# Patient Record
Sex: Male | Born: 1960 | Race: White | Hispanic: No | Smoking: Former smoker
Health system: Southern US, Community
[De-identification: ages and names within clinical notes are randomized; demographics above are authoritative.]

## PROBLEM LIST (undated history)

## (undated) DIAGNOSIS — Z87891 Personal history of nicotine dependence: Secondary | ICD-10-CM

## (undated) HISTORY — PX: BACK SURGERY: SHX140

---

## 2009-11-09 ENCOUNTER — Emergency Department (HOSPITAL_COMMUNITY): Admission: EM | Admit: 2009-11-09 | Discharge: 2009-11-09 | Payer: Self-pay | Admitting: Emergency Medicine

## 2010-09-14 LAB — POCT I-STAT, CHEM 8
Calcium, Ion: 1.14 mmol/L (ref 1.12–1.32)
Glucose, Bld: 164 mg/dL — ABNORMAL HIGH (ref 70–99)
TCO2: 27 mmol/L (ref 0–100)

## 2011-08-02 DIAGNOSIS — F411 Generalized anxiety disorder: Secondary | ICD-10-CM | POA: Diagnosis not present

## 2011-09-20 DIAGNOSIS — F411 Generalized anxiety disorder: Secondary | ICD-10-CM | POA: Diagnosis not present

## 2011-09-28 DIAGNOSIS — F411 Generalized anxiety disorder: Secondary | ICD-10-CM | POA: Diagnosis not present

## 2011-10-05 DIAGNOSIS — E291 Testicular hypofunction: Secondary | ICD-10-CM | POA: Diagnosis not present

## 2011-10-05 DIAGNOSIS — N529 Male erectile dysfunction, unspecified: Secondary | ICD-10-CM | POA: Diagnosis not present

## 2011-10-14 DIAGNOSIS — N529 Male erectile dysfunction, unspecified: Secondary | ICD-10-CM | POA: Diagnosis not present

## 2012-01-10 DIAGNOSIS — F411 Generalized anxiety disorder: Secondary | ICD-10-CM | POA: Diagnosis not present

## 2012-02-24 DIAGNOSIS — E669 Obesity, unspecified: Secondary | ICD-10-CM | POA: Diagnosis not present

## 2012-02-24 DIAGNOSIS — J329 Chronic sinusitis, unspecified: Secondary | ICD-10-CM | POA: Diagnosis not present

## 2012-03-22 DIAGNOSIS — F411 Generalized anxiety disorder: Secondary | ICD-10-CM | POA: Diagnosis not present

## 2012-03-27 DIAGNOSIS — Z79899 Other long term (current) drug therapy: Secondary | ICD-10-CM | POA: Diagnosis not present

## 2012-03-29 DIAGNOSIS — E669 Obesity, unspecified: Secondary | ICD-10-CM | POA: Diagnosis not present

## 2012-04-19 DIAGNOSIS — Z125 Encounter for screening for malignant neoplasm of prostate: Secondary | ICD-10-CM | POA: Diagnosis not present

## 2012-04-19 DIAGNOSIS — E291 Testicular hypofunction: Secondary | ICD-10-CM | POA: Diagnosis not present

## 2012-04-19 DIAGNOSIS — N400 Enlarged prostate without lower urinary tract symptoms: Secondary | ICD-10-CM | POA: Diagnosis not present

## 2012-04-19 DIAGNOSIS — N529 Male erectile dysfunction, unspecified: Secondary | ICD-10-CM | POA: Diagnosis not present

## 2012-05-11 DIAGNOSIS — E291 Testicular hypofunction: Secondary | ICD-10-CM | POA: Diagnosis not present

## 2012-06-19 DIAGNOSIS — Z79899 Other long term (current) drug therapy: Secondary | ICD-10-CM | POA: Diagnosis not present

## 2012-06-23 DIAGNOSIS — L719 Rosacea, unspecified: Secondary | ICD-10-CM | POA: Diagnosis not present

## 2012-06-23 DIAGNOSIS — E669 Obesity, unspecified: Secondary | ICD-10-CM | POA: Diagnosis not present

## 2012-06-27 DIAGNOSIS — F411 Generalized anxiety disorder: Secondary | ICD-10-CM | POA: Diagnosis not present

## 2012-09-05 DIAGNOSIS — E291 Testicular hypofunction: Secondary | ICD-10-CM | POA: Diagnosis not present

## 2012-09-19 DIAGNOSIS — F411 Generalized anxiety disorder: Secondary | ICD-10-CM | POA: Diagnosis not present

## 2012-10-04 DIAGNOSIS — Z23 Encounter for immunization: Secondary | ICD-10-CM | POA: Diagnosis not present

## 2012-10-04 DIAGNOSIS — E669 Obesity, unspecified: Secondary | ICD-10-CM | POA: Diagnosis not present

## 2012-12-19 DIAGNOSIS — F411 Generalized anxiety disorder: Secondary | ICD-10-CM | POA: Diagnosis not present

## 2013-01-17 DIAGNOSIS — Z79899 Other long term (current) drug therapy: Secondary | ICD-10-CM | POA: Diagnosis not present

## 2013-01-17 DIAGNOSIS — L719 Rosacea, unspecified: Secondary | ICD-10-CM | POA: Diagnosis not present

## 2013-01-17 DIAGNOSIS — E669 Obesity, unspecified: Secondary | ICD-10-CM | POA: Diagnosis not present

## 2013-03-21 DIAGNOSIS — F411 Generalized anxiety disorder: Secondary | ICD-10-CM | POA: Diagnosis not present

## 2013-04-10 DIAGNOSIS — E291 Testicular hypofunction: Secondary | ICD-10-CM | POA: Diagnosis not present

## 2013-04-10 DIAGNOSIS — N400 Enlarged prostate without lower urinary tract symptoms: Secondary | ICD-10-CM | POA: Diagnosis not present

## 2013-04-17 DIAGNOSIS — N529 Male erectile dysfunction, unspecified: Secondary | ICD-10-CM | POA: Diagnosis not present

## 2013-04-17 DIAGNOSIS — N400 Enlarged prostate without lower urinary tract symptoms: Secondary | ICD-10-CM | POA: Diagnosis not present

## 2013-04-17 DIAGNOSIS — E291 Testicular hypofunction: Secondary | ICD-10-CM | POA: Diagnosis not present

## 2013-04-25 DIAGNOSIS — Z6834 Body mass index (BMI) 34.0-34.9, adult: Secondary | ICD-10-CM | POA: Diagnosis not present

## 2013-04-25 DIAGNOSIS — E669 Obesity, unspecified: Secondary | ICD-10-CM | POA: Diagnosis not present

## 2013-04-25 DIAGNOSIS — Z713 Dietary counseling and surveillance: Secondary | ICD-10-CM | POA: Diagnosis not present

## 2013-06-06 DIAGNOSIS — F411 Generalized anxiety disorder: Secondary | ICD-10-CM | POA: Diagnosis not present

## 2013-07-24 DIAGNOSIS — E669 Obesity, unspecified: Secondary | ICD-10-CM | POA: Diagnosis not present

## 2013-07-24 DIAGNOSIS — Z713 Dietary counseling and surveillance: Secondary | ICD-10-CM | POA: Diagnosis not present

## 2013-07-24 DIAGNOSIS — Z79899 Other long term (current) drug therapy: Secondary | ICD-10-CM | POA: Diagnosis not present

## 2013-07-24 DIAGNOSIS — Z6832 Body mass index (BMI) 32.0-32.9, adult: Secondary | ICD-10-CM | POA: Diagnosis not present

## 2013-08-24 DIAGNOSIS — J329 Chronic sinusitis, unspecified: Secondary | ICD-10-CM | POA: Diagnosis not present

## 2013-08-24 DIAGNOSIS — L719 Rosacea, unspecified: Secondary | ICD-10-CM | POA: Diagnosis not present

## 2013-11-09 DIAGNOSIS — E669 Obesity, unspecified: Secondary | ICD-10-CM | POA: Diagnosis not present

## 2013-11-09 DIAGNOSIS — Z6833 Body mass index (BMI) 33.0-33.9, adult: Secondary | ICD-10-CM | POA: Diagnosis not present

## 2013-11-09 DIAGNOSIS — Z713 Dietary counseling and surveillance: Secondary | ICD-10-CM | POA: Diagnosis not present

## 2013-12-03 DIAGNOSIS — H43399 Other vitreous opacities, unspecified eye: Secondary | ICD-10-CM | POA: Diagnosis not present

## 2013-12-11 DIAGNOSIS — F411 Generalized anxiety disorder: Secondary | ICD-10-CM | POA: Diagnosis not present

## 2014-03-12 DIAGNOSIS — F411 Generalized anxiety disorder: Secondary | ICD-10-CM | POA: Diagnosis not present

## 2014-04-16 DIAGNOSIS — E291 Testicular hypofunction: Secondary | ICD-10-CM | POA: Diagnosis not present

## 2014-04-16 DIAGNOSIS — Z125 Encounter for screening for malignant neoplasm of prostate: Secondary | ICD-10-CM | POA: Diagnosis not present

## 2014-04-16 DIAGNOSIS — N400 Enlarged prostate without lower urinary tract symptoms: Secondary | ICD-10-CM | POA: Diagnosis not present

## 2014-04-23 DIAGNOSIS — E291 Testicular hypofunction: Secondary | ICD-10-CM | POA: Diagnosis not present

## 2014-04-23 DIAGNOSIS — N5201 Erectile dysfunction due to arterial insufficiency: Secondary | ICD-10-CM | POA: Diagnosis not present

## 2014-06-04 DIAGNOSIS — F411 Generalized anxiety disorder: Secondary | ICD-10-CM | POA: Diagnosis not present

## 2014-08-28 DIAGNOSIS — F411 Generalized anxiety disorder: Secondary | ICD-10-CM | POA: Diagnosis not present

## 2014-10-31 DIAGNOSIS — F411 Generalized anxiety disorder: Secondary | ICD-10-CM | POA: Diagnosis not present

## 2015-02-03 DIAGNOSIS — F411 Generalized anxiety disorder: Secondary | ICD-10-CM | POA: Diagnosis not present

## 2015-04-15 DIAGNOSIS — E291 Testicular hypofunction: Secondary | ICD-10-CM | POA: Diagnosis not present

## 2015-04-15 DIAGNOSIS — Z125 Encounter for screening for malignant neoplasm of prostate: Secondary | ICD-10-CM | POA: Diagnosis not present

## 2015-04-16 DIAGNOSIS — F411 Generalized anxiety disorder: Secondary | ICD-10-CM | POA: Diagnosis not present

## 2015-04-22 DIAGNOSIS — E291 Testicular hypofunction: Secondary | ICD-10-CM | POA: Diagnosis not present

## 2015-04-22 DIAGNOSIS — N400 Enlarged prostate without lower urinary tract symptoms: Secondary | ICD-10-CM | POA: Diagnosis not present

## 2015-04-28 DIAGNOSIS — H1011 Acute atopic conjunctivitis, right eye: Secondary | ICD-10-CM | POA: Diagnosis not present

## 2015-07-24 DIAGNOSIS — F411 Generalized anxiety disorder: Secondary | ICD-10-CM | POA: Diagnosis not present

## 2015-10-15 DIAGNOSIS — F411 Generalized anxiety disorder: Secondary | ICD-10-CM | POA: Diagnosis not present

## 2016-01-07 DIAGNOSIS — F411 Generalized anxiety disorder: Secondary | ICD-10-CM | POA: Diagnosis not present

## 2016-02-28 ENCOUNTER — Observation Stay (HOSPITAL_COMMUNITY)
Admission: EM | Admit: 2016-02-28 | Discharge: 2016-02-29 | Disposition: A | Discharge status: Home / Self Care | Payer: Medicare Other | Attending: Internal Medicine | Admitting: Internal Medicine

## 2016-02-28 ENCOUNTER — Emergency Department (HOSPITAL_COMMUNITY): Payer: Medicare Other

## 2016-02-28 ENCOUNTER — Encounter (HOSPITAL_COMMUNITY): Payer: Self-pay | Admitting: Family Medicine

## 2016-02-28 DIAGNOSIS — R001 Bradycardia, unspecified: Secondary | ICD-10-CM

## 2016-02-28 DIAGNOSIS — R0789 Other chest pain: Secondary | ICD-10-CM | POA: Diagnosis not present

## 2016-02-28 DIAGNOSIS — E669 Obesity, unspecified: Secondary | ICD-10-CM

## 2016-02-28 DIAGNOSIS — Z87891 Personal history of nicotine dependence: Secondary | ICD-10-CM

## 2016-02-28 DIAGNOSIS — R072 Precordial pain: Secondary | ICD-10-CM | POA: Diagnosis not present

## 2016-02-28 DIAGNOSIS — R739 Hyperglycemia, unspecified: Secondary | ICD-10-CM | POA: Diagnosis present

## 2016-02-28 DIAGNOSIS — R079 Chest pain, unspecified: Secondary | ICD-10-CM | POA: Diagnosis present

## 2016-02-28 HISTORY — DX: Personal history of nicotine dependence: Z87.891

## 2016-02-28 LAB — CBC
HEMATOCRIT: 40.4 % (ref 39.0–52.0)
HEMOGLOBIN: 14 g/dL (ref 13.0–17.0)
MCH: 32.4 pg (ref 26.0–34.0)
MCHC: 34.7 g/dL (ref 30.0–36.0)
MCV: 93.5 fL (ref 78.0–100.0)
Platelets: 184 10*3/uL (ref 150–400)
RBC: 4.32 MIL/uL (ref 4.22–5.81)
RDW: 12.2 % (ref 11.5–15.5)
WBC: 9.3 10*3/uL (ref 4.0–10.5)

## 2016-02-28 LAB — TROPONIN I
Troponin I: <0.03 ng/mL (ref <0.03)
Troponin I: <0.03 ng/mL (ref <0.03)

## 2016-02-28 LAB — BASIC METABOLIC PANEL
ANION GAP: 5 (ref 5–15)
BUN: 12 mg/dL (ref 6–20)
CO2: 29 mmol/L (ref 22–32)
Calcium: 9.1 mg/dL (ref 8.9–10.3)
Chloride: 102 mmol/L (ref 101–111)
Creatinine, Ser: 0.97 mg/dL (ref 0.61–1.24)
GFR calc Af Amer: >60 mL/min (ref >60)
GFR calc non Af Amer: >60 mL/min (ref >60)
GLUCOSE: 125 mg/dL — AB (ref 65–99)
POTASSIUM: 4.1 mmol/L (ref 3.5–5.1)
Sodium: 136 mmol/L (ref 135–145)

## 2016-02-28 LAB — C-REACTIVE PROTEIN: CRP: 0.5 mg/dL (ref <1.0)

## 2016-02-28 LAB — I-STAT TROPONIN, ED: Troponin i, poc: 0 ng/mL (ref 0.00–0.08)

## 2016-02-28 MED ORDER — MORPHINE SULFATE (PF) 2 MG/ML IV SOLN: 1.0000 mg | INTRAVENOUS | Status: DC | PRN

## 2016-02-28 MED ORDER — ACETAMINOPHEN 325 MG PO TABS: 650.0000 mg | ORAL_TABLET | ORAL | Status: DC | PRN

## 2016-02-28 MED ORDER — GI COCKTAIL ~~LOC~~: 30.0000 mL | Freq: Four times a day (QID) | ORAL | Status: DC | PRN

## 2016-02-28 MED ORDER — ONDANSETRON HCL 4 MG/2ML IJ SOLN: 4.0000 mg | Freq: Four times a day (QID) | INTRAMUSCULAR | Status: DC | PRN

## 2016-02-28 MED ORDER — ZOLPIDEM TARTRATE 5 MG PO TABS: 5.0000 mg | ORAL_TABLET | Freq: Every evening | ORAL | Status: DC | PRN

## 2016-02-28 MED ORDER — ENOXAPARIN SODIUM 40 MG/0.4ML ~~LOC~~ SOLN: 40.0000 mg | SUBCUTANEOUS | Status: DC

## 2016-02-28 MED ORDER — ASPIRIN EC 325 MG PO TBEC
325.0000 mg | DELAYED_RELEASE_TABLET | Freq: Every day | ORAL | Status: DC
  Administered 2016-02-29: 325 mg via ORAL
  Filled 2016-02-28: qty 1

## 2016-02-28 MED ORDER — ALPRAZOLAM 0.25 MG PO TABS: 0.2500 mg | ORAL_TABLET | Freq: Two times a day (BID) | ORAL | Status: DC | PRN

## 2016-02-28 NOTE — H&P (Signed)
History and Physical    Gary Gallagher:[id number] DOB: 12-24-1960 DOA: 02/28/2016   PCP: Irving Copas, MD   Patient coming from/Resides with: Private residence/was with longtime girlfriend  Chief Complaint: Chest pain  HPI: Gary Gallagher is a 55 y.o. male with medical history significant for former tobacco abuse and obesity. Patient reports that for the past 3 weeks he has been "feeling bad" with no energy. He has been experiencing some dyspnea on exertion with associated nasal and upper chest congestion that he attributed to underlying asthma allergies. He's had itching in his eyes. He has attempted to treat the symptoms of Mucinex. He told the ER doctor he had also been having chest pain radiating up to his neck in the past week with exertion but did not tell me this symptom. Today he and another family member had disassembled and were attempting to pick up the bottom portion of the lift chair (2 men picking up the base which weighed about 70 pounds). Prior to this, the patient felt okay but while he was picking of the chair he developed left anterior-inferior significant chest pain. He described this as a hard pain felt better if he pressed on his chest directly over the location of the pain. This was associated with shortness of breath. He also developed diffuse diaphoresis which included the extremities. He rested but the chest pain never completely resolved and he describes the pain as being "nagging". He walked around the residence without any significant improvement in his pain. He walked to the bathroom so he could see what he looked like and noticed he was quite pale and diaphoretic. He continued walking through the house when he developed a significant increase in the pain which he now described "felt that somebody was sitting on his chest". In addition he was now having pain radiating up into the upper part of the mouth and he stated his teeth felt numb. Again  the pain felt better when he pressed on his chest. He continue to report trouble breathing and felt at this point he needed to come to the hospital for evaluation. Of note he has not had any lower extremity swelling. He has not taken any long trips or car rides. He has not had any fevers or chills with these congestion symptoms reported.   EMS was called to the home. He was given aspirin in the field and one nitroglycerin. He had a vasovagal response to the nitroglycerin en route with heart rate decreasing into the 30s. He was given a fluid bolus since he was having chest pressure with the bradycardia. His initial blood pressure in the field was 156/94 and reported as 142/88 before the nitroglycerin. Unclear if blood pressure was checked while bradycardic after the nitroglycerin.  ED Course:  Vital signs: 97.9-99/69-75-13-room air 97% Repeat vital signs: 113/70 9-87-13-96 percent on room air Two-view chest x-ray: No acute cardiopulmonary disease Lab data: Sodium 136, potassium 4.1, BUN 12, creatinine 0.97, glucose 125, poc troponin 0.00, white count 9300, hemoglobin 14, platelets 184,000. Medications and treatments: None while in ER patient did receive aspirin in nitroglycerin in the field as listed above  Review of Systems:  In addition to the HPI above,  No Fever-chills, myalgias or other constitutional symptoms No Headache, changes with Vision or hearing, new weakness, tingling, numbness in any extremity, No problems swallowing food or Liquids, indigestion/reflux No palpitations or DOE No Abdominal pain, N/V; no melena or hematochezia, no dark tarry stools No dysuria, hematuria or flank  pain No new skin rashes, lesions, masses or bruises, No new joints pains-aches No recent weight gain or loss No polyuria, polydypsia or polyphagia,   History reviewed. No pertinent past medical history.  Past Surgical History:  Procedure Laterality Date  . BACK SURGERY      Social History   Social  History  . Marital status: Divorced    Spouse name: N/A  . Number of children: N/A  . Years of education: N/A   Occupational History  . Not on file.   Social History Main Topics  . Smoking status: Never Smoker  . Smokeless tobacco: Never Used  . Alcohol use No  . Drug use: Unknown  . Sexual activity: Not on file   Other Topics Concern  . Not on file   Social History Narrative  . No narrative on file    Mobility: Without assistive devices Work history: Patient reports is retired   No Known Allergies  Family history reviewed and not pertinent; father with history of CVA   Prior to Admission medications   Not on File    Physical Exam: Vitals:   02/28/16 1500 02/28/16 1545 02/28/16 1630 02/28/16 1701  BP: 103/72 124/72 113/79   Pulse: 86 79 91 88  Resp:  19    Temp:    98 F (36.7 C)  TempSrc:    Oral  SpO2: 93% 95% 96% 99%  Weight:    109.8 kg (242 lb)  Height:    5\' 8"  (1.727 m)      Constitutional: NAD, calm, comfortable and currently chest pain-free Eyes: PERRL, lids and conjunctivae normal ENMT: Mucous membranes are moist. Posterior pharynx clear of any exudate or lesions.Normal dentition.  Neck: normal, supple, no masses, no thyromegaly Respiratory: clear to auscultation bilaterally, no wheezing, no crackles. Normal respiratory effort. No accessory muscle use. Chest pain not reproducible with palpation over anterior chest wall Cardiovascular: Regular rate and rhythm, no murmurs / rubs / gallops. No extremity edema. 2+ pedal pulses. No carotid bruits.  Abdomen: no tenderness, no masses palpated. No hepatosplenomegaly. Bowel sounds positive.  Musculoskeletal: no clubbing / cyanosis. No joint deformity upper and lower extremities. Good ROM, no contractures. Normal muscle tone.  Skin: no rashes, lesions, ulcers. No induration Neurologic: CN 2-12 grossly intact. Sensation intact, DTR normal. Strength 5/5 x all 4 extremities.  Psychiatric: Normal judgment and  insight. Alert and oriented x 3. Normal mood.    Labs on Admission: I have personally reviewed following labs and imaging studies  CBC:  Recent Labs Lab 02/28/16 1338  WBC 9.3  HGB 14.0  HCT 40.4  MCV 93.5  PLT 184   Basic Metabolic Panel:  Recent Labs Lab 02/28/16 1338  NA 136  K 4.1  CL 102  CO2 29  GLUCOSE 125*  BUN 12  CREATININE 0.97  CALCIUM 9.1   GFR: Estimated Creatinine Clearance: 104.7 mL/min (by C-G formula based on SCr of 0.97 mg/dL). Liver Function Tests: No results for input(s): AST, ALT, ALKPHOS, BILITOT, PROT, ALBUMIN in the last 168 hours. No results for input(s): LIPASE, AMYLASE in the last 168 hours. No results for input(s): AMMONIA in the last 168 hours. Coagulation Profile: No results for input(s): INR, PROTIME in the last 168 hours. Cardiac Enzymes: No results for input(s): CKTOTAL, CKMB, CKMBINDEX, TROPONINI in the last 168 hours. BNP (last 3 results) No results for input(s): PROBNP in the last 8760 hours. HbA1C: No results for input(s): HGBA1C in the last 72 hours. CBG: No results  for input(s): GLUCAP in the last 168 hours. Lipid Profile: No results for input(s): CHOL, HDL, LDLCALC, TRIG, CHOLHDL, LDLDIRECT in the last 72 hours. Thyroid Function Tests: No results for input(s): TSH, T4TOTAL, FREET4, T3FREE, THYROIDAB in the last 72 hours. Anemia Panel: No results for input(s): VITAMINB12, FOLATE, FERRITIN, TIBC, IRON, RETICCTPCT in the last 72 hours. Urine analysis: No results found for: COLORURINE, APPEARANCEUR, LABSPEC, PHURINE, GLUCOSEU, HGBUR, BILIRUBINUR, KETONESUR, PROTEINUR, UROBILINOGEN, NITRITE, LEUKOCYTESUR Sepsis Labs: @LABRCNTIP (procalcitonin:4,lacticidven:4) )No results found for this or any previous visit (from the past 240 hour(s)).   Radiological Exams on Admission: Dg Chest 2 View  Result Date: 02/28/2016 CLINICAL DATA:  Mid chest pain starting 30 minutes ago EXAM: CHEST  2 VIEW COMPARISON:  None. FINDINGS: The heart  size and mediastinal contours are within normal limits. There is no focal infiltrate, pulmonary edema, or pleural effusion. Chronic change of the left clavicle is identified. IMPRESSION: No active cardiopulmonary disease. Electronically Signed   By: Sherian Rein M.D.   On: 02/28/2016 13:22    EKG: (Independently reviewed) sinus rhythm with ventricular rate 76 bpm, QTC 440 ms, no acute ischemic changes, normal T-wave progression  Assessment/Plan Principal Problem:   Chest pain -Patient presents with both typical and atypical features of chest pain with multiple associated symptoms including diaphoresis, shortness of breath, radiation to the mouth and jaw, and apparent symptomatology relieved after receiving nitroglycerin. More concerning is the fact patient is minimizing precedents symptomatology stating his dyspnea on exertion for the past 3 weeks was likely related to the fact that "I am overweight and I've been having allergy symptoms" -Heart score equals 5 -Cycle troponin-if negative would proceed with Myoview stress test; I have made nothing by mouth after midnight in the event he needs to proceed with stress test -Echocardiogram -Aspirin 325 daily -Avoid nitrates given vasovagal response documented in route via EMS -EKG nonischemic and patient now chest pain-free so no indication for full dose anticoagulation -Blood pressure somewhat suboptimal and with recent vasovagal activity and likelihood patient will proceed with stress test in morning will not give beta blocker -Lipid panel in a.m.  Active Problems:   Former tobacco use -Risk factor for CAD -Smoking 1997; states smoked at least one pack to one and half packs per day for about 30 years    Acute hyperglycemia -Patient overweight therefore will check hemoglobin A1c   Obesity -Will need to pursue weight reduction strategies with PCP      DVT prophylaxis: Lovenox Code Status: Full Family Communication: No family at  bedside Disposition Plan: Anticipate discharge to home environment pending cardiac ischemic workup Consults called: None  Admission status: Observation/telemetry     ELLIS,ALLISON L. ANP-BC [facility] Pager [phone]   If 7PM-7AM, please contact night-coverage [email or web address] Password [email or web address]  02/28/2016, 5:40 PM

## 2016-02-28 NOTE — ED Triage Notes (Signed)
Pt waas helping brother move recliner about 30 pounds. sts during the lifting he felt pain in chest in the center and sharp. He became SOB, dizziness and diaphoresis. Pt took 324 ASA and was given 1 nitro in route. Pt had vaso vagal response to nitro. Pt given fluid bolus and was better. sts still some chest pressure. Pt HR brady down to 30.  BP initially, 156/94. 142/88 before nitro. sts chest felt better after nitro,.

## 2016-02-28 NOTE — ED Provider Notes (Signed)
[facility]-EMERGENCY DEPT Provider Note   CSN: [id number] Arrival date & time: 02/28/16  1250     History   Chief Complaint Chief Complaint  Patient presents with  . Chest Pain    HPI Rosita Fireimothy R Berko is a 55 y.o. male.  HPI [place] right-sided parasternal chest pain onset 30 minutes prior to arrival. Pain radiated to his upper teeth. Onset while carrying furniture with his brother. Symptoms lasted for 30 minutes accompanied by profuse diaphoresis and generalized malaise. He also reports tightness in his chest with walking intermittently for the past one week. He is presently asymptomatic. He was treated by EMS with 4 baby aspirin's and one sublingual nitroglycerin tablet. Nitroglycerin improved pain however he had a vasovagal event immediately after taking nitroglycerin with heart rate decreasing to the 30s. He is presently asymptomatic. No other associated symptoms.  History reviewed. No pertinent past medical history.  There are no active problems to display for this patient.   Past Surgical History:  Procedure Laterality Date  . BACK SURGERY         Home Medications    Prior to Admission medications   Not on File    Family History History reviewed. No pertinent family history.  Social History Social History  Substance Use Topics  . Smoking status: Never Smoker  . Smokeless tobacco: Never Used  . Alcohol use No     Allergies   Review of patient's allergies indicates no known allergies.   Review of Systems Review of Systems  Constitutional: Positive for diaphoresis.       Generalized malaise  HENT: Negative.   Respiratory: Negative.   Cardiovascular: Positive for chest pain.  Gastrointestinal: Negative.   Musculoskeletal: Negative.   Skin: Negative.   Neurological: Negative.   Psychiatric/Behavioral: Negative.   All other systems reviewed and are negative.    Physical Exam Updated Vital Signs BP 112/72   Pulse 76   Temp 97.9 F (36.6 C)    Resp 18   Ht 5\' 8"  (1.727 m)   Wt 250 lb (113.4 kg)   SpO2 97%   BMI 38.01 kg/m   Physical Exam  Constitutional: He appears well-developed and well-nourished.  HENT:  Head: Normocephalic and atraumatic.  Eyes: Conjunctivae are normal. Pupils are equal, round, and reactive to light.  Neck: Neck supple. No tracheal deviation present. No thyromegaly present.  Cardiovascular: Normal rate and regular rhythm.   No murmur heard. Pulmonary/Chest: Effort normal and breath sounds normal.  Abdominal: Soft. Bowel sounds are normal. He exhibits no distension. There is no tenderness.  Obese  Musculoskeletal: Normal range of motion. He exhibits no edema or tenderness.  Neurological: He is alert. Coordination normal.  Skin: Skin is warm and dry. No rash noted.  Psychiatric: He has a normal mood and affect.  Nursing note and vitals reviewed.    ED Treatments / Results  Labs (all labs ordered are listed, but only abnormal results are displayed) Labs Reviewed  BASIC METABOLIC PANEL  CBC  I-STAT TROPOININ, ED    EKG  EKG Interpretation  Date/Time:  Saturday February 28 2016 12:55:04 EDT Ventricular Rate:  76 PR Interval:    QRS Duration: 102 QT Interval:  391 QTC Calculation: 440 R Axis:   -4 Text Interpretation:  Sinus rhythm Low voltage, precordial leads No significant change since last tracing Confirmed by Ethelda ChickJACUBOWITZ  MD, Takenya Travaglini [phone]([id number]) on 02/28/2016 1:07:56 PM       Radiology Dg Chest 2 View  Result Date: 02/28/2016 CLINICAL  DATA:  Mid chest pain starting 30 minutes ago EXAM: CHEST  2 VIEW COMPARISON:  None. FINDINGS: The heart size and mediastinal contours are within normal limits. There is no focal infiltrate, pulmonary edema, or pleural effusion. Chronic change of the left clavicle is identified. IMPRESSION: No active cardiopulmonary disease. Electronically Signed   By: Sherian Rein M.D.   On: 02/28/2016 13:22    Procedures Procedures (including critical care  time)  Medications Ordered in ED Medications - No data to display Results for orders placed or performed during the hospital encounter of 02/28/16  Basic metabolic panel  Result Value Ref Range   Sodium 136 135 - 145 mmol/L   Potassium 4.1 3.5 - 5.1 mmol/L   Chloride 102 101 - 111 mmol/L   CO2 29 22 - 32 mmol/L   Glucose, Bld 125 (H) 65 - 99 mg/dL   BUN 12 6 - 20 mg/dL   Creatinine, Ser 1.61 0.61 - 1.24 mg/dL   Calcium 9.1 8.9 - 09.6 mg/dL   GFR calc non Af Amer >60 >60 mL/min   GFR calc Af Amer >60 >60 mL/min   Anion gap 5 5 - 15  CBC  Result Value Ref Range   WBC 9.3 4.0 - 10.5 K/uL   RBC 4.32 4.22 - 5.81 MIL/uL   Hemoglobin 14.0 13.0 - 17.0 g/dL   HCT 04.5 40.9 - 81.1 %   MCV 93.5 78.0 - 100.0 fL   MCH 32.4 26.0 - 34.0 pg   MCHC 34.7 30.0 - 36.0 g/dL   RDW 91.4 78.2 - 95.6 %   Platelets 184 150 - 400 K/uL  I-stat troponin, ED  Result Value Ref Range   Troponin i, poc 0.00 0.00 - 0.08 ng/mL   Comment 3           Dg Chest 2 View  Result Date: 02/28/2016 CLINICAL DATA:  Mid chest pain starting 30 minutes ago EXAM: CHEST  2 VIEW COMPARISON:  None. FINDINGS: The heart size and mediastinal contours are within normal limits. There is no focal infiltrate, pulmonary edema, or pleural effusion. Chronic change of the left clavicle is identified. IMPRESSION: No active cardiopulmonary disease. Electronically Signed   By: Sherian Rein M.D.   On: 02/28/2016 13:22  Chest x-ray viewed by me  Initial Impression / Assessment and Plan / ED Course  I have reviewed the triage vital signs and the nursing notes.  Pertinent labs & imaging results that were available during my care of the patient were reviewed by me and considered in my medical decision making (see chart for details).  Clinical Course   3:25 PM remains asymptomatic. Chest x-ray viewed by me Heart score equals 5. Hospitalist service consulted and will see patient in ED Final Clinical Impressions(s) / ED Diagnoses  Diagnosis  exertional chest pain Final diagnoses:  None    New Prescriptions New Prescriptions   No medications on file     Doug Sou, MD 02/28/16 1546

## 2016-02-28 NOTE — ED Notes (Signed)
Patient transported to X-ray 

## 2016-02-29 ENCOUNTER — Observation Stay (HOSPITAL_BASED_OUTPATIENT_CLINIC_OR_DEPARTMENT_OTHER): Payer: Medicare Other

## 2016-02-29 ENCOUNTER — Encounter (HOSPITAL_COMMUNITY): Payer: Self-pay | Admitting: Physician Assistant

## 2016-02-29 ENCOUNTER — Other Ambulatory Visit: Payer: Self-pay | Admitting: Physician Assistant

## 2016-02-29 DIAGNOSIS — R001 Bradycardia, unspecified: Secondary | ICD-10-CM | POA: Diagnosis not present

## 2016-02-29 DIAGNOSIS — R079 Chest pain, unspecified: Secondary | ICD-10-CM | POA: Diagnosis not present

## 2016-02-29 DIAGNOSIS — R55 Syncope and collapse: Secondary | ICD-10-CM

## 2016-02-29 DIAGNOSIS — R0789 Other chest pain: Secondary | ICD-10-CM | POA: Diagnosis not present

## 2016-02-29 DIAGNOSIS — R7309 Other abnormal glucose: Secondary | ICD-10-CM | POA: Diagnosis not present

## 2016-02-29 DIAGNOSIS — E669 Obesity, unspecified: Secondary | ICD-10-CM

## 2016-02-29 LAB — LIPID PANEL
Cholesterol: 173 mg/dL (ref 0–200)
HDL: 39 mg/dL — AB (ref >40)
LDL CALC: 90 mg/dL (ref 0–99)
TRIGLYCERIDES: 220 mg/dL — AB (ref <150)
Total CHOL/HDL Ratio: 4.4 RATIO
VLDL: 44 mg/dL — ABNORMAL HIGH (ref 0–40)

## 2016-02-29 LAB — EXERCISE TOLERANCE TEST
CSEPED: 7 min
CSEPEDS: 30 s
CSEPHR: 96 %
CSEPPHR: 160 {beats}/min
Estimated workload: 9.3 METS
MPHR: 166 {beats}/min
Rest HR: 79 {beats}/min

## 2016-02-29 LAB — TROPONIN I

## 2016-02-29 LAB — HEMOGLOBIN A1C
HEMOGLOBIN A1C: 5.6 % (ref 4.8–5.6)
MEAN PLASMA GLUCOSE: 114 mg/dL

## 2016-02-29 MED ORDER — ASPIRIN 81 MG PO TBEC: 81.0000 mg | DELAYED_RELEASE_TABLET | Freq: Every day | ORAL | 0 refills | Status: AC

## 2016-02-29 MED ORDER — ASPIRIN EC 81 MG PO TBEC: 81.0000 mg | DELAYED_RELEASE_TABLET | Freq: Every day | ORAL | Status: DC

## 2016-02-29 NOTE — Progress Notes (Signed)
ETT normal. Good exercise capacity. Mild functional limitation by chronic leg issues due to back pain but no chest pain or SOB. Exercised 7:30. D/w Dr Gala RomneyBensimhon. Does not need inpatient echo - will plan on outpatient echo and f/u. I have sent a message to our [facility] [employer] sched[facility] requesting these, and our office will call the patient with this information. Pt made aware. Will notify IM. Evolet Salminen PA-C

## 2016-02-29 NOTE — Discharge Summary (Signed)
Physician Discharge Summary  BAUER AUSBORN ZOX:[id number] DOB: 07/17/1960 DOA: 02/28/2016  PCP: Irving Copas, MD  Admit date: 02/28/2016 Discharge date: 02/29/2016  Admitted From: Home  Disposition: home   Recommendations for Outpatient Follow-up:  1. Follow up with PCP in 1-2 weeks 2. Please obtain BMP/CBC in one week 3. Follow up with cardiology for ECHO  4. Needs lipid panel, might need medications for cholesterol.     Discharge Condition: stable.  CODE STATUS: Full code.  Diet recommendation: Heart Healthy   Brief/Interim Summary: Gary Eberwein Holland[place]is a 55 y.o.malewith medical history significant for former tobacco abuse and obesity. Patient reports that for the past 3 weeks he has been "feeling bad" with no energy. He has been experiencing some dyspnea on exertion with associated nasal and upper chest congestion that he attributed to underlying asthma allergies. he had also been having chest pain radiating up to his neck in the past week with exertion. The day of admission while he was picking up a lift chair he develop chest pain, which persisted. Sharp in quality.   EMS was called to the home. He was given aspirin in the field and one nitroglycerin. He had a vasovagal response to the nitroglycerin en route with heart rate decreasing into the 30s. He was given a fluid bolus since he was having chest pressure with the bradycardia. His initial blood pressure in the field was 156/94 and reported as 142/88 before the nitroglycerin. Unclear ifblood pressure was checked while bradycardic after the nitroglycerin.  1-Chest pain:  Risk factors, age, obesity, former  Smoker.  Troponin negatives times 3.  LDL 90. Chest x ray no active cardiopulmonary diseases.  EKG; sinus, low voltage precordial lead. No ST elevation.  Avoid nitrates given vasovagal response documented in route via EMS ETT normal.  Outpatient follow up with cardiology for ECHO.   Elevated  trygliceride; discussed diet with patient. Need repeat labs, if triglycerides still elevated he will need tricor.   Bradycardia, after nitro, vaso vagal./ resolved.    Discharge Diagnoses:  Principal Problem:   Chest pain Active Problems:   Former tobacco use   Acute hyperglycemia   Bradycardia after NTG use   Obesity    Discharge Instructions  Discharge Instructions    Diet - low sodium heart healthy    Complete by:  As directed   Increase activity slowly    Complete by:  As directed     Follow-up Information    [facility] [employer] .   Specialty:  Cardiology Why:  The office will call you to arrange your follow-up heart ultrasound and appointment. Contact information: [address], [address] [place] [address] [phone]         Allergies  Allergen Reactions  . Nitroglycerin     Dropped HR by EMS 02/2016     Medication List    TAKE these medications   aspirin 81 MG EC tablet Take 1 tablet (81 mg total) by mouth daily. Start taking on:  03/01/2016      Consultations:  Cardiology    Procedures/Studies: Dg Chest 2 View  Result Date: 02/28/2016 CLINICAL DATA:  Mid chest pain starting 30 minutes ago EXAM: CHEST  2 VIEW COMPARISON:  None. FINDINGS: The heart size and mediastinal contours are within normal limits. There is no focal infiltrate, pulmonary edema, or pleural effusion. Chronic change of the left clavicle is identified. IMPRESSION: No active cardiopulmonary disease. Electronically Signed   By: Gabriel Carina.D.  On: 02/28/2016 13:22   ETT; normal.    Subjective: Feeling better, denies chest pain.   Discharge Exam: Vitals:   02/29/16 0736 02/29/16 1148  BP: 122/80 118/80  Pulse: 80 71  Resp: 16 15  Temp: 98.1 F (36.7 C) 98 F (36.7 C)   Vitals:   02/29/16 0500 02/29/16 0730 02/29/16 0736 02/29/16 1148  BP: 116/80 120/81 122/80 118/80  Pulse: 82  80 71  Resp: 15  16 15   Temp: 98.4 F (36.9 C)  98.1  F (36.7 C) 98 F (36.7 C)  TempSrc:   Oral Oral  SpO2: 97%  98% 97%  Weight: 109.1 kg (240 lb 9.6 oz)     Height:        General: Pt is alert, awake, not in acute distress Cardiovascular: RRR, S1/S2 +, no rubs, no gallops Respiratory: CTA bilaterally, no wheezing, no rhonchi Abdominal: Soft, NT, ND, bowel sounds + Extremities: no edema, no cyanosis    The results of significant diagnostics from this hospitalization (including imaging, microbiology, ancillary and laboratory) are listed below for reference.     Microbiology: No results found for this or any previous visit (from the past 240 hour(s)).   Labs: BNP (last 3 results) No results for input(s): BNP in the last 8760 hours. Basic Metabolic Panel:  Recent Labs Lab 02/28/16 1338  NA 136  K 4.1  CL 102  CO2 29  GLUCOSE 125*  BUN 12  CREATININE 0.97  CALCIUM 9.1   Liver Function Tests: No results for input(s): AST, ALT, ALKPHOS, BILITOT, PROT, ALBUMIN in the last 168 hours. No results for input(s): LIPASE, AMYLASE in the last 168 hours. No results for input(s): AMMONIA in the last 168 hours. CBC:  Recent Labs Lab 02/28/16 1338  WBC 9.3  HGB 14.0  HCT 40.4  MCV 93.5  PLT 184   Cardiac Enzymes:  Recent Labs Lab 02/28/16 1657 02/28/16 2057 02/29/16 0208  TROPONINI <0.03 <0.03 <0.03   BNP: Invalid input(s): POCBNP CBG: No results for input(s): GLUCAP in the last 168 hours. D-Dimer No results for input(s): DDIMER in the last 72 hours. Hgb A1c No results for input(s): HGBA1C in the last 72 hours. Lipid Profile  Recent Labs  02/29/16 0208  CHOL 173  HDL 39*  LDLCALC 90  TRIG 147220*  CHOLHDL 4.4   Thyroid function studies No results for input(s): TSH, T4TOTAL, T3FREE, THYROIDAB in the last 72 hours.  Invalid input(s): FREET3 Anemia work up No results for input(s): VITAMINB12, FOLATE, FERRITIN, TIBC, IRON, RETICCTPCT in the last 72 hours. Urinalysis No results found for: COLORURINE,  APPEARANCEUR, LABSPEC, PHURINE, GLUCOSEU, HGBUR, BILIRUBINUR, KETONESUR, PROTEINUR, UROBILINOGEN, NITRITE, LEUKOCYTESUR Sepsis Labs Invalid input(s): PROCALCITONIN,  WBC,  LACTICIDVEN Microbiology No results found for this or any previous visit (from the past 240 hour(s)).   Time coordinating discharge: Over 30 minutes  SIGNED:   Alba Coryegalado, Melida Northington A, MD  [facility] 02/29/2016, 2:47 PM Pager [phone]  If 7PM-7AM, please contact night-coverage [email or web address] Password [email or web address]

## 2016-02-29 NOTE — Progress Notes (Signed)
Pt's assessment unchanged from this am. D/c'd via wheelchair with pt wife in stable condition

## 2016-02-29 NOTE — Consult Note (Signed)
Cardiology Consultation Note    Patient ID: Gary Gallagher, MRN: [id number], DOB/AGE: 54-Jun-1962 54 y.o. Admit date: 02/28/2016   Date of Consult: 02/29/2016 Primary Physician: Irving Copas, MD Primary Cardiologist: New  Chief Complaint: chest pain Reason for Consultation: chest pain Requesting MD: Dr. Sunnie Nielsen  HPI: Gary Gallagher is a 55 y.o. male with history of obesity & former tobacco abuse (25 years) but no family or personal history of heart disease. He was in his usual state of heath yesterday around 9am when helping his brother move a lift chair by the base. He was picking up the chair and leaning over when he felt a pulling sensation in his anterior chest. He began sweating and sat down. He did not feel any better so he went inside the house to drink some water. He went into the bathroom and looked at himself and felt like he looked pale. He began walking out of the bathroom and began feeling "really weird" and drenching sweats, weak in the legs. His family called 911. He told IM he had radiation of sx up to his mouth and teeth. He was told to take 324mg  of ASA. When EMS arrived he received 1 SL NTG which did ease off chest pain but per report he had a vagal event where he dropped his HR into the 30s and felt really bad. He was given a fluid bolus. His initial blood pressure in the field was 156/94 and reported as 142/88 before the nitroglycerin. Unclear if blood pressure was checked while bradycardic after the nitroglycerin. BP in the ER down to 99/69, received IVF. His chest pain continued to subside spontaneously while in the ER, lasting about 1 hour total. He reports a prior episode of CP about 1 year ago similar to this episode but none since. Does not exercise. Reports occasional DOE when he is experiencing seasonal allergies. Not tachycardic, tachypneic or hypoxic. No LEE or orthopnea. Workup notable for neg trop x 4, glu 125, LDL 90, otherwise labs wnl, CXR NAD  with normal heart size and mediastinal contours. Telemetry with HR upper 40s-50s sleeping hours, otherwise 60s-90s. Feels fine now.  Past Medical History:  Diagnosis Date  . Former tobacco use       Surgical History:  Past Surgical History:  Procedure Laterality Date  . BACK SURGERY       Home Meds: Prior to Admission medications   Not on File    Inpatient Medications:  . aspirin EC  325 mg Oral Daily  . enoxaparin (LOVENOX) injection  40 mg Subcutaneous Q24H      Allergies: No Known Allergies  Social History   Social History  . Marital status: Divorced    Spouse name: N/A  . Number of children: N/A  . Years of education: N/A   Occupational History  . Not on file.   Social History Main Topics  . Smoking status: Former Games developer  . Smokeless tobacco: Never Used     Comment: Smoked for 25 years, quit 1990s  . Alcohol use No  . Drug use: No  . Sexual activity: Not on file   Other Topics Concern  . Not on file   Social History Narrative  . No narrative on file     Family History  Problem Relation Age of Onset  . Stroke Father      Review of Systems: All other systems reviewed and are otherwise negative except as noted above.  Labs:  Recent Labs  02/28/16  1657 02/28/16 2057 02/29/16 0208  TROPONINI <0.03 <0.03 <0.03   Lab Results  Component Value Date   WBC 9.3 02/28/2016   HGB 14.0 02/28/2016   HCT 40.4 02/28/2016   MCV 93.5 02/28/2016   PLT 184 02/28/2016    Recent Labs Lab 02/28/16 1338  NA 136  K 4.1  CL 102  CO2 29  BUN 12  CREATININE 0.97  CALCIUM 9.1  GLUCOSE 125*   Lab Results  Component Value Date   CHOL 173 02/29/2016   HDL 39 (L) 02/29/2016   LDLCALC 90 02/29/2016   TRIG 220 (H) 02/29/2016   Radiology/Studies:  Dg Chest 2 View  Result Date: 02/28/2016 CLINICAL DATA:  Mid chest pain starting 30 minutes ago EXAM: CHEST  2 VIEW COMPARISON:  None. FINDINGS: The heart size and mediastinal contours are within normal  limits. There is no focal infiltrate, pulmonary edema, or pleural effusion. Chronic change of the left clavicle is identified. IMPRESSION: No active cardiopulmonary disease. Electronically Signed   By: Sherian ReinWei-Chen  Lin M.D.   On: 02/28/2016 13:22    Wt Readings from Last 3 Encounters:  02/29/16 240 lb 9.6 oz (109.1 kg)   EKG: NSR 76bpm, low voltage precoridal leads, no acute changes  Physical Exam: Blood pressure 122/80, pulse 80, temperature 98.1 F (36.7 C), temperature source Oral, resp. rate 16, height 5\' 8"  (1.727 m), weight 240 lb 9.6 oz (109.1 kg), SpO2 98 %. Body mass index is 36.58 kg/m. General: Well developed, well nourished obese WM in no acute distress. Head: Normocephalic, atraumatic, sclera non-icteric, no xanthomas, nares are without discharge.  Neck: Negative for carotid bruits. JVD not elevated. Lungs: Clear bilaterally to auscultation without wheezes, rales, or rhonchi. Breathing is unlabored. Heart: RRR with S1 S2. No murmurs, rubs, or gallops appreciated. Abdomen: Soft, non-tender, non-distended with normoactive bowel sounds. No hepatomegaly. No rebound/guarding. No obvious abdominal masses. Msk:  Strength and tone appear normal for age. Extremities: No clubbing or cyanosis. No edema.  Distal pedal pulses are 2+ and equal bilaterally. Neuro: Alert and oriented X 3. No facial asymmetry. No focal deficit. Moves all extremities spontaneously. Psych:  Responds to questions appropriately with a normal affect.     Assessment and Plan   55 y.o. male with history of obesity, former tobacco abuse (25 years), and glucose of 125 admitted with chest pain/diaphoresis after lifting moderate sized awkward lift-chair. Rec'd 1 SL NTG by EMS but reported to have vasovagal response with HR dropping to 30s.  1. Chest pain - troponins neg x 4. Initial onset sounds MSK in etiology. Other vague symptoms including SOB, diaphoresis, feeling "weird" are more concerning - although I wonder if he  developed a vagal reaction after becoming anxious about his symptoms. EKG unremarkable, enzymes negative. Will review plan with MD.  2. Bradycardia after NTG - ? vasovagal response to NTG. Avoid AVN blocking agents for now. HR stable.  3. Obesity - Body mass index is 36.58 kg/m. - he acknowledges being overweight as a possible contribution to sx.  Signed, Laurann Montanaayna N Dunn PA-C 02/29/2016, 9:10 AM Pager: [phone]   Patient seen and examined with Ronie Spiesayna Dunn, PA-C. We discussed all aspects of the encounter. I agree with the assessment and plan as stated above.   55 y/o with no known CAD. + obesity. Remote tobacco. Extensive emotional stress lately with death of his mother and taking care of his father. Presents with CP and possible vagal event while lifting. ECG and troponin negative. Given CRFs will  proceed with ETT to further risk stratify. Hopefully home after ETT.   Bensimhon, Daniel,MD 9:39 AM

## 2016-03-10 DIAGNOSIS — L719 Rosacea, unspecified: Secondary | ICD-10-CM | POA: Diagnosis not present

## 2016-03-10 DIAGNOSIS — J309 Allergic rhinitis, unspecified: Secondary | ICD-10-CM | POA: Diagnosis not present

## 2016-03-16 DIAGNOSIS — H01004 Unspecified blepharitis left upper eyelid: Secondary | ICD-10-CM | POA: Diagnosis not present

## 2016-03-16 DIAGNOSIS — H01005 Unspecified blepharitis left lower eyelid: Secondary | ICD-10-CM | POA: Diagnosis not present

## 2016-03-16 DIAGNOSIS — R0789 Other chest pain: Secondary | ICD-10-CM | POA: Diagnosis not present

## 2016-03-16 DIAGNOSIS — G2581 Restless legs syndrome: Secondary | ICD-10-CM | POA: Diagnosis not present

## 2016-03-16 DIAGNOSIS — K219 Gastro-esophageal reflux disease without esophagitis: Secondary | ICD-10-CM | POA: Diagnosis not present

## 2016-03-16 DIAGNOSIS — H01002 Unspecified blepharitis right lower eyelid: Secondary | ICD-10-CM | POA: Diagnosis not present

## 2016-03-16 DIAGNOSIS — R739 Hyperglycemia, unspecified: Secondary | ICD-10-CM | POA: Diagnosis not present

## 2016-03-16 DIAGNOSIS — H01001 Unspecified blepharitis right upper eyelid: Secondary | ICD-10-CM | POA: Diagnosis not present

## 2016-03-16 DIAGNOSIS — E669 Obesity, unspecified: Secondary | ICD-10-CM | POA: Diagnosis not present

## 2016-03-30 DIAGNOSIS — F411 Generalized anxiety disorder: Secondary | ICD-10-CM | POA: Diagnosis not present

## 2016-04-20 DIAGNOSIS — N400 Enlarged prostate without lower urinary tract symptoms: Secondary | ICD-10-CM | POA: Diagnosis not present

## 2016-04-20 DIAGNOSIS — E291 Testicular hypofunction: Secondary | ICD-10-CM | POA: Diagnosis not present

## 2016-06-11 ENCOUNTER — Other Ambulatory Visit: Payer: Self-pay

## 2016-06-11 ENCOUNTER — Ambulatory Visit (HOSPITAL_COMMUNITY): Payer: Medicare Other | Attending: Cardiology

## 2016-06-11 DIAGNOSIS — R079 Chest pain, unspecified: Secondary | ICD-10-CM | POA: Insufficient documentation

## 2016-06-11 DIAGNOSIS — I700 Atherosclerosis of aorta: Secondary | ICD-10-CM | POA: Insufficient documentation

## 2016-06-16 DIAGNOSIS — F411 Generalized anxiety disorder: Secondary | ICD-10-CM | POA: Diagnosis not present

## 2016-06-23 ENCOUNTER — Encounter: Payer: Self-pay | Admitting: *Deleted

## 2016-07-01 DIAGNOSIS — H524 Presbyopia: Secondary | ICD-10-CM | POA: Diagnosis not present

## 2016-07-01 DIAGNOSIS — H2513 Age-related nuclear cataract, bilateral: Secondary | ICD-10-CM | POA: Diagnosis not present

## 2016-07-01 DIAGNOSIS — H52203 Unspecified astigmatism, bilateral: Secondary | ICD-10-CM | POA: Diagnosis not present

## 2016-07-01 DIAGNOSIS — H04123 Dry eye syndrome of bilateral lacrimal glands: Secondary | ICD-10-CM | POA: Diagnosis not present

## 2016-07-01 DIAGNOSIS — H5203 Hypermetropia, bilateral: Secondary | ICD-10-CM | POA: Diagnosis not present

## 2016-09-07 DIAGNOSIS — F411 Generalized anxiety disorder: Secondary | ICD-10-CM | POA: Diagnosis not present

## 2016-09-14 DIAGNOSIS — J101 Influenza due to other identified influenza virus with other respiratory manifestations: Secondary | ICD-10-CM | POA: Diagnosis not present

## 2016-09-14 DIAGNOSIS — R509 Fever, unspecified: Secondary | ICD-10-CM | POA: Diagnosis not present

## 2016-10-19 DIAGNOSIS — E291 Testicular hypofunction: Secondary | ICD-10-CM | POA: Diagnosis not present

## 2016-11-24 DIAGNOSIS — F411 Generalized anxiety disorder: Secondary | ICD-10-CM | POA: Diagnosis not present

## 2017-01-03 DIAGNOSIS — H2513 Age-related nuclear cataract, bilateral: Secondary | ICD-10-CM | POA: Diagnosis not present

## 2017-02-24 DIAGNOSIS — F411 Generalized anxiety disorder: Secondary | ICD-10-CM | POA: Diagnosis not present

## 2017-04-25 DIAGNOSIS — R972 Elevated prostate specific antigen [PSA]: Secondary | ICD-10-CM | POA: Diagnosis not present

## 2017-04-25 DIAGNOSIS — E291 Testicular hypofunction: Secondary | ICD-10-CM | POA: Diagnosis not present

## 2017-05-03 DIAGNOSIS — E291 Testicular hypofunction: Secondary | ICD-10-CM | POA: Diagnosis not present

## 2017-05-12 DIAGNOSIS — F411 Generalized anxiety disorder: Secondary | ICD-10-CM | POA: Diagnosis not present

## 2017-07-11 DIAGNOSIS — H04123 Dry eye syndrome of bilateral lacrimal glands: Secondary | ICD-10-CM | POA: Diagnosis not present

## 2017-07-11 DIAGNOSIS — H52203 Unspecified astigmatism, bilateral: Secondary | ICD-10-CM | POA: Diagnosis not present

## 2017-07-11 DIAGNOSIS — L718 Other rosacea: Secondary | ICD-10-CM | POA: Diagnosis not present

## 2017-07-11 DIAGNOSIS — H2513 Age-related nuclear cataract, bilateral: Secondary | ICD-10-CM | POA: Diagnosis not present

## 2017-07-11 DIAGNOSIS — H5203 Hypermetropia, bilateral: Secondary | ICD-10-CM | POA: Diagnosis not present

## 2017-07-11 DIAGNOSIS — H524 Presbyopia: Secondary | ICD-10-CM | POA: Diagnosis not present

## 2017-07-26 DIAGNOSIS — F411 Generalized anxiety disorder: Secondary | ICD-10-CM | POA: Diagnosis not present

## 2017-10-13 DIAGNOSIS — F411 Generalized anxiety disorder: Secondary | ICD-10-CM | POA: Diagnosis not present

## 2018-01-12 DIAGNOSIS — F411 Generalized anxiety disorder: Secondary | ICD-10-CM | POA: Diagnosis not present

## 2018-02-03 DIAGNOSIS — M5136 Other intervertebral disc degeneration, lumbar region: Secondary | ICD-10-CM | POA: Diagnosis not present

## 2018-02-03 DIAGNOSIS — E6609 Other obesity due to excess calories: Secondary | ICD-10-CM | POA: Diagnosis not present

## 2018-02-03 DIAGNOSIS — Z136 Encounter for screening for cardiovascular disorders: Secondary | ICD-10-CM | POA: Diagnosis not present

## 2018-02-03 DIAGNOSIS — Z0000 Encounter for general adult medical examination without abnormal findings: Secondary | ICD-10-CM | POA: Diagnosis not present

## 2018-02-03 DIAGNOSIS — Z131 Encounter for screening for diabetes mellitus: Secondary | ICD-10-CM | POA: Diagnosis not present

## 2018-02-03 DIAGNOSIS — L719 Rosacea, unspecified: Secondary | ICD-10-CM | POA: Diagnosis not present

## 2018-02-03 DIAGNOSIS — Z1159 Encounter for screening for other viral diseases: Secondary | ICD-10-CM | POA: Diagnosis not present

## 2018-02-03 DIAGNOSIS — J309 Allergic rhinitis, unspecified: Secondary | ICD-10-CM | POA: Diagnosis not present

## 2018-03-17 DIAGNOSIS — H109 Unspecified conjunctivitis: Secondary | ICD-10-CM | POA: Diagnosis not present

## 2018-03-17 DIAGNOSIS — E669 Obesity, unspecified: Secondary | ICD-10-CM | POA: Diagnosis not present

## 2018-04-18 DIAGNOSIS — F411 Generalized anxiety disorder: Secondary | ICD-10-CM | POA: Diagnosis not present

## 2018-04-25 DIAGNOSIS — E6609 Other obesity due to excess calories: Secondary | ICD-10-CM | POA: Diagnosis not present

## 2018-04-25 DIAGNOSIS — Z79899 Other long term (current) drug therapy: Secondary | ICD-10-CM | POA: Diagnosis not present

## 2018-05-03 DIAGNOSIS — R972 Elevated prostate specific antigen [PSA]: Secondary | ICD-10-CM | POA: Diagnosis not present

## 2018-05-03 DIAGNOSIS — E291 Testicular hypofunction: Secondary | ICD-10-CM | POA: Diagnosis not present

## 2018-05-09 DIAGNOSIS — E291 Testicular hypofunction: Secondary | ICD-10-CM | POA: Diagnosis not present

## 2018-05-24 IMAGING — DX DG CHEST 2V
2 series · 2 of 2 positions shown · non-contrast
Comparison: None.

CLINICAL DATA: Mid chest pain starting 30 minutes ago

EXAM:
CHEST  2 VIEW

[w chest lat]
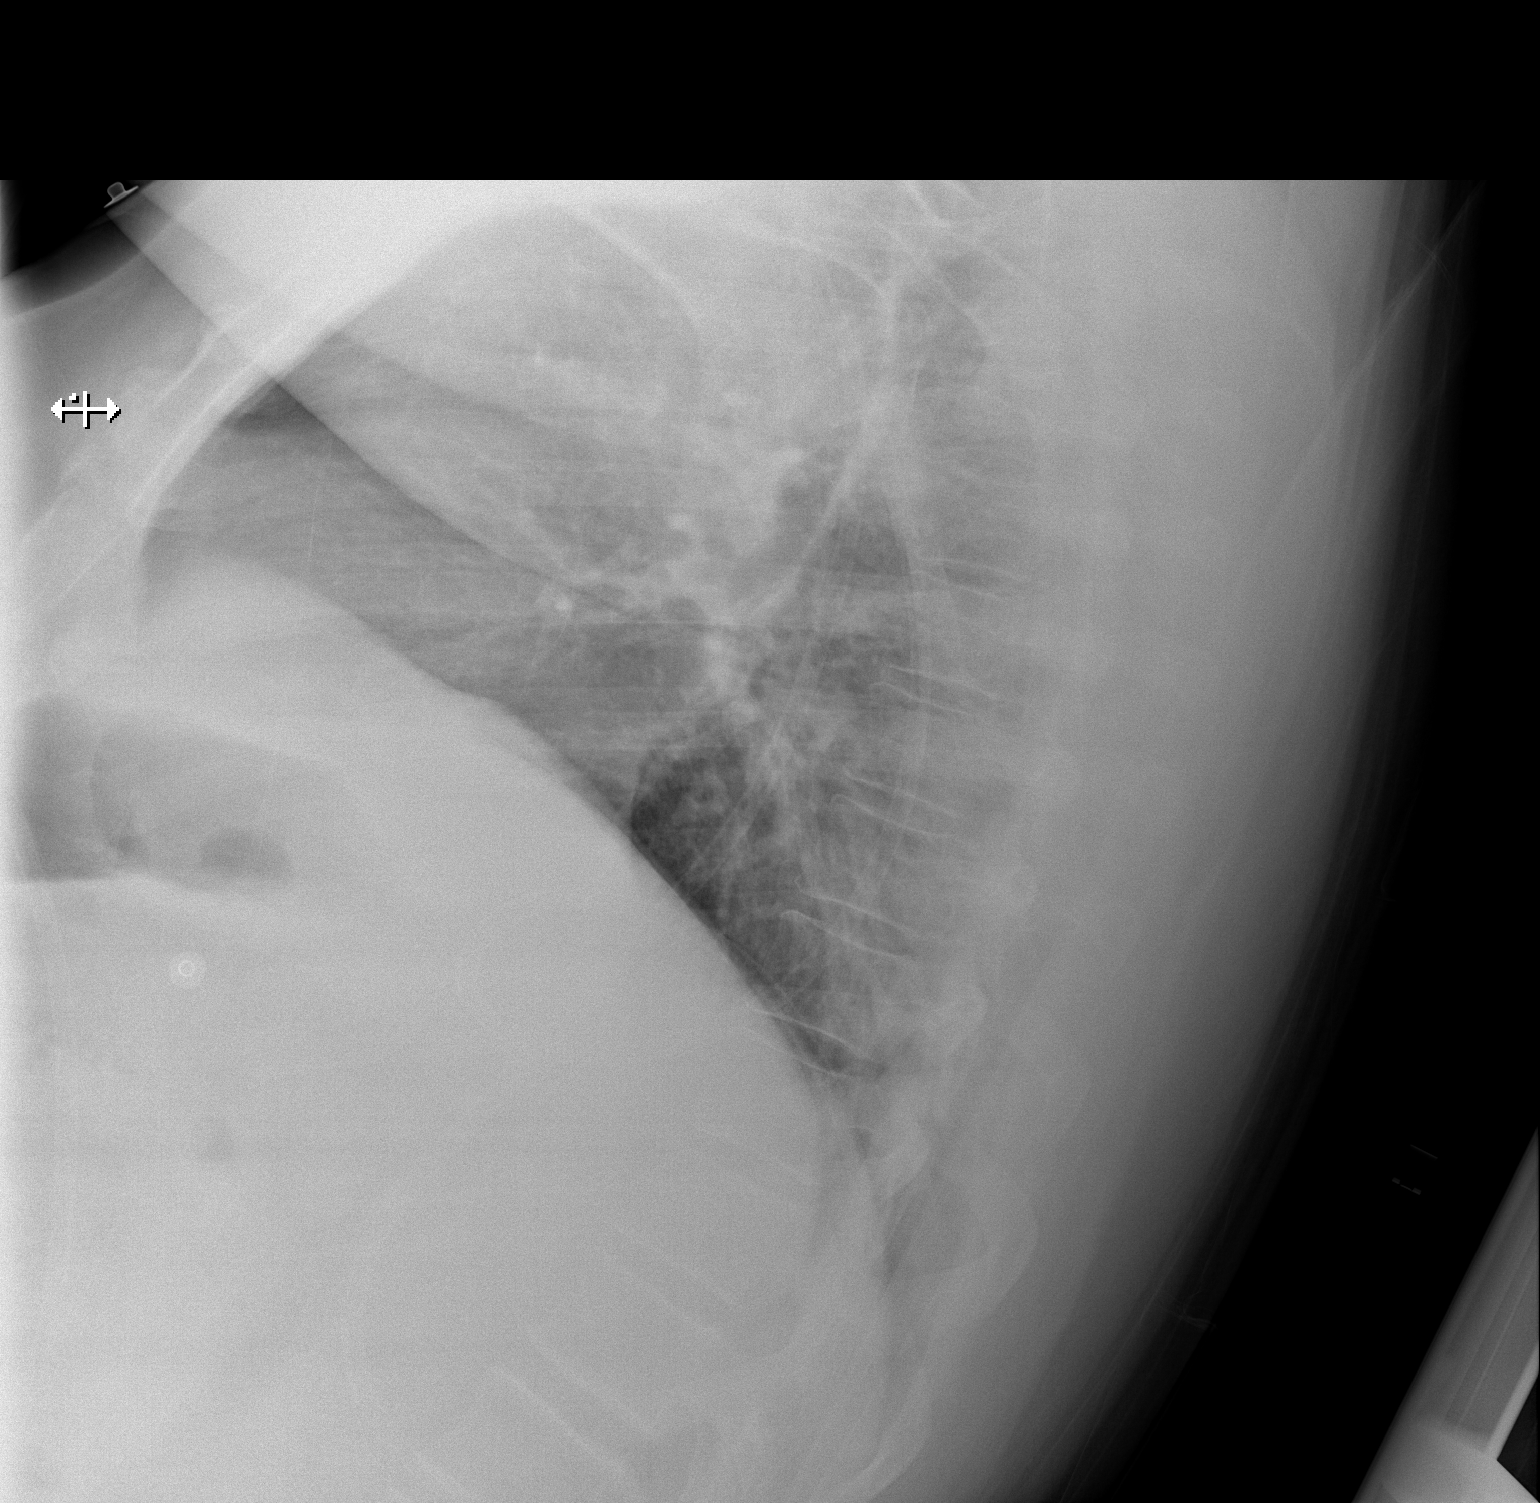

[w chest pa]
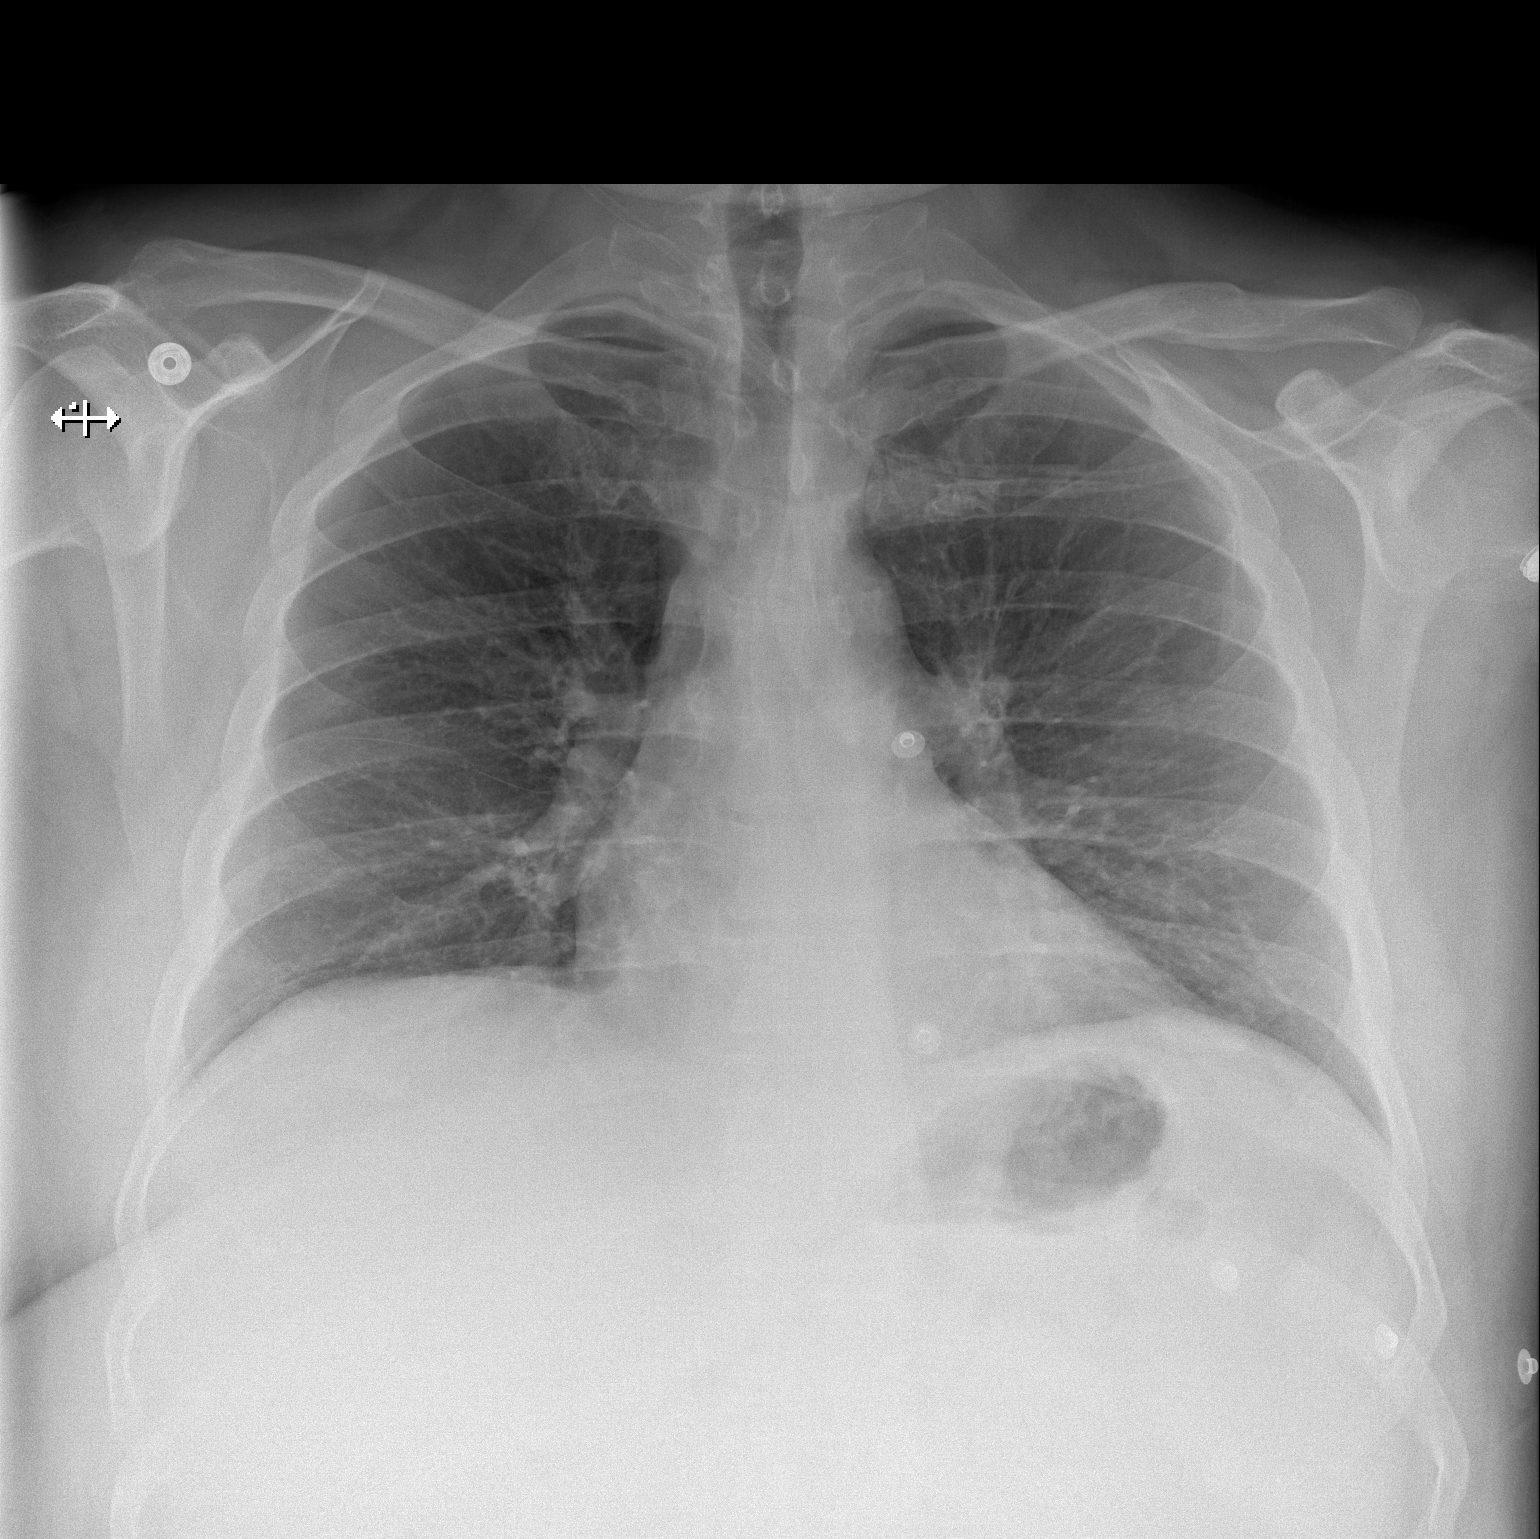

[2 of 2 positions shown; findings below may reference images not displayed]

FINDINGS: The heart size and mediastinal contours are within normal limits.
There is no focal infiltrate, pulmonary edema, or pleural effusion.
Chronic change of the left clavicle is identified.
IMPRESSION: No active cardiopulmonary disease.

## 2018-05-30 DIAGNOSIS — H1013 Acute atopic conjunctivitis, bilateral: Secondary | ICD-10-CM | POA: Diagnosis not present

## 2018-05-30 DIAGNOSIS — Z713 Dietary counseling and surveillance: Secondary | ICD-10-CM | POA: Diagnosis not present

## 2018-05-30 DIAGNOSIS — E6609 Other obesity due to excess calories: Secondary | ICD-10-CM | POA: Diagnosis not present

## 2018-07-06 DIAGNOSIS — F411 Generalized anxiety disorder: Secondary | ICD-10-CM | POA: Diagnosis not present

## 2018-08-04 DIAGNOSIS — R030 Elevated blood-pressure reading, without diagnosis of hypertension: Secondary | ICD-10-CM | POA: Diagnosis not present

## 2018-08-04 DIAGNOSIS — E6609 Other obesity due to excess calories: Secondary | ICD-10-CM | POA: Diagnosis not present

## 2018-08-04 DIAGNOSIS — Z713 Dietary counseling and surveillance: Secondary | ICD-10-CM | POA: Diagnosis not present

## 2018-08-04 DIAGNOSIS — M79671 Pain in right foot: Secondary | ICD-10-CM | POA: Diagnosis not present

## 2018-08-04 DIAGNOSIS — M79672 Pain in left foot: Secondary | ICD-10-CM | POA: Diagnosis not present

## 2018-08-22 ENCOUNTER — Encounter (HOSPITAL_COMMUNITY): Payer: Self-pay | Admitting: Emergency Medicine

## 2018-08-22 ENCOUNTER — Ambulatory Visit (HOSPITAL_COMMUNITY)
Admission: EM | Admit: 2018-08-22 | Discharge: 2018-08-22 | Disposition: A | Discharge status: Home / Self Care | Payer: Medicare Other | Attending: Family Medicine | Admitting: Family Medicine

## 2018-08-22 DIAGNOSIS — L03114 Cellulitis of left upper limb: Secondary | ICD-10-CM | POA: Diagnosis not present

## 2018-08-22 MED ORDER — DOXYCYCLINE HYCLATE 100 MG PO CAPS: 100.0000 mg | ORAL_CAPSULE | Freq: Two times a day (BID) | ORAL | 0 refills | Status: AC

## 2018-08-22 NOTE — ED Provider Notes (Signed)
[facility]   [id number] 08/22/18 Arrival Time: 1757  CC: SKIN COMPLAINT  SUBJECTIVE:  Gary Gallagher is a 58 y.o. male who presents with a rash that began 2 days ago.  Denies precipitating event or trauma.  Speculates a bug may have bit him.  Denies close contacts with similar rash.  Localizes the rash to left wrist.  Describes it as painful, red, and swelling.  Has tried peroxide and neosporin with minimal relief.  Symptoms are made worse at night.  Denies similar symptoms in the past.   Denies fever, chills, nausea, vomiting, oral lesions, SOB, chest pain, abdominal pain, changes in bowel or bladder function.    Up to date on tetanus within the past 2 years.    ROS: As per HPI.  Past Medical History:  Diagnosis Date  . Former tobacco use    Past Surgical History:  Procedure Laterality Date  . BACK SURGERY     Allergies  Allergen Reactions  . Nitroglycerin     Dropped HR by EMS 02/2016   No current facility-administered medications on file prior to encounter.    Current Outpatient Medications on File Prior to Encounter  Medication Sig Dispense Refill  . aspirin EC 81 MG EC tablet Take 1 tablet (81 mg total) by mouth daily. 30 tablet 0   Social History   Socioeconomic History  . Marital status: Divorced    Spouse name: Not on file  . Number of children: Not on file  . Years of education: Not on file  . Highest education level: Not on file  Occupational History  . Not on file  Social Needs  . Financial resource strain: Not on file  . Food insecurity:    Worry: Not on file    Inability: Not on file  . Transportation needs:    Medical: Not on file    Non-medical: Not on file  Tobacco Use  . Smoking status: Former Games developer  . Smokeless tobacco: Never Used  . Tobacco comment: Smoked for 25 years, quit 1990s  Substance and Sexual Activity  . Alcohol use: No  . Drug use: No  . Sexual activity: Not on file  Lifestyle  . Physical activity:   Days per week: Not on file    Minutes per session: Not on file  . Stress: Not on file  Relationships  . Social connections:    Talks on phone: Not on file    Gets together: Not on file    Attends religious service: Not on file    Active member of club or organization: Not on file    Attends meetings of clubs or organizations: Not on file    Relationship status: Not on file  . Intimate partner violence:    Fear of current or ex partner: Not on file    Emotionally abused: Not on file    Physically abused: Not on file    Forced sexual activity: Not on file  Other Topics Concern  . Not on file  Social History Narrative  . Not on file   Family History  Problem Relation Age of Onset  . Stroke Father     OBJECTIVE: Vitals:   08/22/18 1841  BP: (!) 150/95  Pulse: 76  Resp: 18  Temp: 98.5 F (36.9 C)  SpO2: 100%    General appearance: alert; no distress Head: NCAT Lungs: normal respiratory effort Heart: Radial pulse 2+ bilaterally; cap refill <2 seconds Extremities: no edema Skin: warm and dry;  three circular lesions approximately 0.25 cm in diameter localized to lateral and anterior wrist with surrounding erythema, TTP, no obvious drainage or bleeding, blanches with pressure (see picture below) Psychological: alert and cooperative; normal mood and affect        ASSESSMENT & PLAN:  1. Cellulitis of left upper extremity     Meds ordered this encounter  Medications  . doxycycline (VIBRAMYCIN) 100 MG capsule    Sig: Take 1 capsule (100 mg total) by mouth 2 (two) times daily.    Dispense:  20 capsule    Refill:  0    Order Specific Question:   Supervising Provider    Answer:   Eustace Moore [[id number]]   Prescribed doxycycline take as directed and to completion Continue to alternate ibuprofen and tylenol as needed for pain and fever Follow up with PCP if symptoms persists Return or go to the ED if you have any new or worsening symptoms such as increased pain,  redness, swelling, discharge, high fever, night sweats, abdominal pain, sensation changes in hand, color changes in hand, decreased circulation in hand, etc...   Reviewed expectations re: course of current medical issues. Questions answered. Outlined signs and symptoms indicating need for more acute intervention. Patient verbalized understanding. After Visit Summary given.   Rennis Harding, PA-C 08/22/18 1909

## 2018-08-22 NOTE — ED Triage Notes (Signed)
Pt states he noticed a bite on his L wrist, unsure what bit him, states its swollen and red and "looks like staph".

## 2018-08-22 NOTE — Discharge Instructions (Signed)
Prescribed doxycycline take as directed and to completion Continue to alternate ibuprofen and tylenol as needed for pain and fever Follow up with PCP if symptoms persists Return or go to the ED if you have any new or worsening symptoms such as increased pain, redness, swelling, discharge, high fever, night sweats, abdominal pain, sensation changes in hand, color changes in hand, decreased circulation in hand, etc..Marland Kitchen

## 2018-11-01 DIAGNOSIS — F411 Generalized anxiety disorder: Secondary | ICD-10-CM | POA: Diagnosis not present

## 2018-12-26 DIAGNOSIS — Z79899 Other long term (current) drug therapy: Secondary | ICD-10-CM | POA: Diagnosis not present

## 2019-02-12 DIAGNOSIS — F331 Major depressive disorder, recurrent, moderate: Secondary | ICD-10-CM | POA: Diagnosis not present

## 2019-02-12 DIAGNOSIS — F411 Generalized anxiety disorder: Secondary | ICD-10-CM | POA: Diagnosis not present

## 2019-03-16 DIAGNOSIS — M5136 Other intervertebral disc degeneration, lumbar region: Secondary | ICD-10-CM | POA: Diagnosis not present

## 2019-03-16 DIAGNOSIS — Z13220 Encounter for screening for lipoid disorders: Secondary | ICD-10-CM | POA: Diagnosis not present

## 2019-03-16 DIAGNOSIS — H6121 Impacted cerumen, right ear: Secondary | ICD-10-CM | POA: Diagnosis not present

## 2019-03-16 DIAGNOSIS — K219 Gastro-esophageal reflux disease without esophagitis: Secondary | ICD-10-CM | POA: Diagnosis not present

## 2019-03-16 DIAGNOSIS — Z0000 Encounter for general adult medical examination without abnormal findings: Secondary | ICD-10-CM | POA: Diagnosis not present

## 2019-03-27 DIAGNOSIS — H9191 Unspecified hearing loss, right ear: Secondary | ICD-10-CM | POA: Diagnosis not present

## 2019-03-27 DIAGNOSIS — H6121 Impacted cerumen, right ear: Secondary | ICD-10-CM | POA: Diagnosis not present

## 2019-05-08 DIAGNOSIS — F331 Major depressive disorder, recurrent, moderate: Secondary | ICD-10-CM | POA: Diagnosis not present

## 2019-05-08 DIAGNOSIS — F411 Generalized anxiety disorder: Secondary | ICD-10-CM | POA: Diagnosis not present

## 2019-05-14 DIAGNOSIS — R972 Elevated prostate specific antigen [PSA]: Secondary | ICD-10-CM | POA: Diagnosis not present

## 2019-05-14 DIAGNOSIS — E291 Testicular hypofunction: Secondary | ICD-10-CM | POA: Diagnosis not present

## 2019-05-17 DIAGNOSIS — N400 Enlarged prostate without lower urinary tract symptoms: Secondary | ICD-10-CM | POA: Diagnosis not present

## 2019-05-17 DIAGNOSIS — E291 Testicular hypofunction: Secondary | ICD-10-CM | POA: Diagnosis not present

## 2019-05-17 DIAGNOSIS — Z125 Encounter for screening for malignant neoplasm of prostate: Secondary | ICD-10-CM | POA: Diagnosis not present

## 2019-07-24 DIAGNOSIS — F411 Generalized anxiety disorder: Secondary | ICD-10-CM | POA: Diagnosis not present

## 2019-07-24 DIAGNOSIS — F331 Major depressive disorder, recurrent, moderate: Secondary | ICD-10-CM | POA: Diagnosis not present

## 2019-09-18 DIAGNOSIS — E6609 Other obesity due to excess calories: Secondary | ICD-10-CM | POA: Diagnosis not present

## 2019-09-18 DIAGNOSIS — Z79899 Other long term (current) drug therapy: Secondary | ICD-10-CM | POA: Diagnosis not present

## 2019-10-16 DIAGNOSIS — F411 Generalized anxiety disorder: Secondary | ICD-10-CM | POA: Diagnosis not present

## 2019-10-16 DIAGNOSIS — F331 Major depressive disorder, recurrent, moderate: Secondary | ICD-10-CM | POA: Diagnosis not present

## 2020-01-07 DIAGNOSIS — F411 Generalized anxiety disorder: Secondary | ICD-10-CM | POA: Diagnosis not present

## 2020-01-07 DIAGNOSIS — F331 Major depressive disorder, recurrent, moderate: Secondary | ICD-10-CM | POA: Diagnosis not present

## 2020-03-31 DIAGNOSIS — F411 Generalized anxiety disorder: Secondary | ICD-10-CM | POA: Diagnosis not present

## 2020-03-31 DIAGNOSIS — F331 Major depressive disorder, recurrent, moderate: Secondary | ICD-10-CM | POA: Diagnosis not present

## 2020-05-21 DIAGNOSIS — R972 Elevated prostate specific antigen [PSA]: Secondary | ICD-10-CM | POA: Diagnosis not present

## 2020-05-21 DIAGNOSIS — E291 Testicular hypofunction: Secondary | ICD-10-CM | POA: Diagnosis not present

## 2020-05-29 DIAGNOSIS — N5201 Erectile dysfunction due to arterial insufficiency: Secondary | ICD-10-CM | POA: Diagnosis not present

## 2020-05-29 DIAGNOSIS — E291 Testicular hypofunction: Secondary | ICD-10-CM | POA: Diagnosis not present

## 2020-06-13 DIAGNOSIS — F411 Generalized anxiety disorder: Secondary | ICD-10-CM | POA: Diagnosis not present

## 2020-06-13 DIAGNOSIS — F331 Major depressive disorder, recurrent, moderate: Secondary | ICD-10-CM | POA: Diagnosis not present

## 2020-07-08 DIAGNOSIS — H0016 Chalazion left eye, unspecified eyelid: Secondary | ICD-10-CM | POA: Diagnosis not present

## 2020-07-08 DIAGNOSIS — Z136 Encounter for screening for cardiovascular disorders: Secondary | ICD-10-CM | POA: Diagnosis not present

## 2020-07-08 DIAGNOSIS — Z0000 Encounter for general adult medical examination without abnormal findings: Secondary | ICD-10-CM | POA: Diagnosis not present

## 2020-07-08 DIAGNOSIS — H0013 Chalazion right eye, unspecified eyelid: Secondary | ICD-10-CM | POA: Diagnosis not present

## 2020-07-08 DIAGNOSIS — Z6836 Body mass index (BMI) 36.0-36.9, adult: Secondary | ICD-10-CM | POA: Diagnosis not present

## 2020-09-08 DIAGNOSIS — D582 Other hemoglobinopathies: Secondary | ICD-10-CM | POA: Diagnosis not present

## 2020-09-08 DIAGNOSIS — E7800 Pure hypercholesterolemia, unspecified: Secondary | ICD-10-CM | POA: Diagnosis not present

## 2020-09-09 DIAGNOSIS — F411 Generalized anxiety disorder: Secondary | ICD-10-CM | POA: Diagnosis not present

## 2020-09-09 DIAGNOSIS — F331 Major depressive disorder, recurrent, moderate: Secondary | ICD-10-CM | POA: Diagnosis not present

## 2020-12-02 DIAGNOSIS — F331 Major depressive disorder, recurrent, moderate: Secondary | ICD-10-CM | POA: Diagnosis not present

## 2020-12-02 DIAGNOSIS — F411 Generalized anxiety disorder: Secondary | ICD-10-CM | POA: Diagnosis not present

## 2020-12-03 DIAGNOSIS — E291 Testicular hypofunction: Secondary | ICD-10-CM | POA: Diagnosis not present

## 2021-01-26 DIAGNOSIS — J069 Acute upper respiratory infection, unspecified: Secondary | ICD-10-CM | POA: Diagnosis not present

## 2021-02-26 DIAGNOSIS — F411 Generalized anxiety disorder: Secondary | ICD-10-CM | POA: Diagnosis not present

## 2021-02-26 DIAGNOSIS — F331 Major depressive disorder, recurrent, moderate: Secondary | ICD-10-CM | POA: Diagnosis not present

## 2021-06-01 DIAGNOSIS — E291 Testicular hypofunction: Secondary | ICD-10-CM | POA: Diagnosis not present

## 2021-06-01 DIAGNOSIS — R972 Elevated prostate specific antigen [PSA]: Secondary | ICD-10-CM | POA: Diagnosis not present

## 2021-06-04 DIAGNOSIS — F411 Generalized anxiety disorder: Secondary | ICD-10-CM | POA: Diagnosis not present

## 2021-06-04 DIAGNOSIS — F331 Major depressive disorder, recurrent, moderate: Secondary | ICD-10-CM | POA: Diagnosis not present

## 2021-06-08 DIAGNOSIS — N5201 Erectile dysfunction due to arterial insufficiency: Secondary | ICD-10-CM | POA: Diagnosis not present

## 2021-06-08 DIAGNOSIS — R350 Frequency of micturition: Secondary | ICD-10-CM | POA: Diagnosis not present

## 2021-06-08 DIAGNOSIS — E291 Testicular hypofunction: Secondary | ICD-10-CM | POA: Diagnosis not present

## 2021-06-08 DIAGNOSIS — N401 Enlarged prostate with lower urinary tract symptoms: Secondary | ICD-10-CM | POA: Diagnosis not present

## 2022-01-08 DIAGNOSIS — E782 Mixed hyperlipidemia: Secondary | ICD-10-CM | POA: Diagnosis not present

## 2022-01-08 DIAGNOSIS — E119 Type 2 diabetes mellitus without complications: Secondary | ICD-10-CM | POA: Diagnosis not present

## 2022-01-08 DIAGNOSIS — M25571 Pain in right ankle and joints of right foot: Secondary | ICD-10-CM | POA: Diagnosis not present

## 2022-01-08 DIAGNOSIS — E291 Testicular hypofunction: Secondary | ICD-10-CM | POA: Diagnosis not present

## 2022-01-08 DIAGNOSIS — E669 Obesity, unspecified: Secondary | ICD-10-CM | POA: Diagnosis not present

## 2022-01-08 DIAGNOSIS — Z6836 Body mass index (BMI) 36.0-36.9, adult: Secondary | ICD-10-CM | POA: Diagnosis not present

## 2022-02-04 DIAGNOSIS — E782 Mixed hyperlipidemia: Secondary | ICD-10-CM | POA: Diagnosis not present

## 2022-02-04 DIAGNOSIS — E669 Obesity, unspecified: Secondary | ICD-10-CM | POA: Diagnosis not present

## 2022-02-04 DIAGNOSIS — M2550 Pain in unspecified joint: Secondary | ICD-10-CM | POA: Diagnosis not present

## 2022-02-04 DIAGNOSIS — E118 Type 2 diabetes mellitus with unspecified complications: Secondary | ICD-10-CM | POA: Diagnosis not present

## 2022-02-04 DIAGNOSIS — E65 Localized adiposity: Secondary | ICD-10-CM | POA: Diagnosis not present

## 2022-02-04 DIAGNOSIS — R632 Polyphagia: Secondary | ICD-10-CM | POA: Diagnosis not present

## 2022-02-04 DIAGNOSIS — Z6836 Body mass index (BMI) 36.0-36.9, adult: Secondary | ICD-10-CM | POA: Diagnosis not present

## 2022-03-04 DIAGNOSIS — R69 Illness, unspecified: Secondary | ICD-10-CM | POA: Diagnosis not present

## 2022-03-04 DIAGNOSIS — F411 Generalized anxiety disorder: Secondary | ICD-10-CM | POA: Diagnosis not present

## 2022-03-18 DIAGNOSIS — Z6835 Body mass index (BMI) 35.0-35.9, adult: Secondary | ICD-10-CM | POA: Diagnosis not present

## 2022-03-18 DIAGNOSIS — E65 Localized adiposity: Secondary | ICD-10-CM | POA: Diagnosis not present

## 2022-03-18 DIAGNOSIS — E669 Obesity, unspecified: Secondary | ICD-10-CM | POA: Diagnosis not present

## 2022-03-18 DIAGNOSIS — E119 Type 2 diabetes mellitus without complications: Secondary | ICD-10-CM | POA: Diagnosis not present

## 2022-03-18 DIAGNOSIS — R632 Polyphagia: Secondary | ICD-10-CM | POA: Diagnosis not present

## 2022-03-31 DIAGNOSIS — R7303 Prediabetes: Secondary | ICD-10-CM | POA: Diagnosis not present

## 2022-03-31 DIAGNOSIS — H5203 Hypermetropia, bilateral: Secondary | ICD-10-CM | POA: Diagnosis not present

## 2022-03-31 DIAGNOSIS — H04123 Dry eye syndrome of bilateral lacrimal glands: Secondary | ICD-10-CM | POA: Diagnosis not present

## 2022-03-31 DIAGNOSIS — H52203 Unspecified astigmatism, bilateral: Secondary | ICD-10-CM | POA: Diagnosis not present

## 2022-03-31 DIAGNOSIS — H2513 Age-related nuclear cataract, bilateral: Secondary | ICD-10-CM | POA: Diagnosis not present

## 2022-03-31 DIAGNOSIS — H524 Presbyopia: Secondary | ICD-10-CM | POA: Diagnosis not present

## 2022-04-23 DIAGNOSIS — L719 Rosacea, unspecified: Secondary | ICD-10-CM | POA: Diagnosis not present

## 2022-04-23 DIAGNOSIS — E782 Mixed hyperlipidemia: Secondary | ICD-10-CM | POA: Diagnosis not present

## 2022-04-23 DIAGNOSIS — E119 Type 2 diabetes mellitus without complications: Secondary | ICD-10-CM | POA: Diagnosis not present

## 2022-04-23 DIAGNOSIS — E291 Testicular hypofunction: Secondary | ICD-10-CM | POA: Diagnosis not present

## 2022-04-23 DIAGNOSIS — Z6834 Body mass index (BMI) 34.0-34.9, adult: Secondary | ICD-10-CM | POA: Diagnosis not present

## 2022-04-23 DIAGNOSIS — Z5320 Procedure and treatment not carried out because of patient's decision for unspecified reasons: Secondary | ICD-10-CM | POA: Diagnosis not present

## 2022-04-23 DIAGNOSIS — Z2821 Immunization not carried out because of patient refusal: Secondary | ICD-10-CM | POA: Diagnosis not present

## 2022-04-23 DIAGNOSIS — E669 Obesity, unspecified: Secondary | ICD-10-CM | POA: Diagnosis not present

## 2022-05-03 DIAGNOSIS — E669 Obesity, unspecified: Secondary | ICD-10-CM | POA: Diagnosis not present

## 2022-05-03 DIAGNOSIS — E782 Mixed hyperlipidemia: Secondary | ICD-10-CM | POA: Diagnosis not present

## 2022-05-03 DIAGNOSIS — E1169 Type 2 diabetes mellitus with other specified complication: Secondary | ICD-10-CM | POA: Diagnosis not present

## 2022-05-03 DIAGNOSIS — E65 Localized adiposity: Secondary | ICD-10-CM | POA: Diagnosis not present

## 2022-05-03 DIAGNOSIS — Z6834 Body mass index (BMI) 34.0-34.9, adult: Secondary | ICD-10-CM | POA: Diagnosis not present

## 2022-05-26 DIAGNOSIS — F411 Generalized anxiety disorder: Secondary | ICD-10-CM | POA: Diagnosis not present

## 2022-05-26 DIAGNOSIS — R69 Illness, unspecified: Secondary | ICD-10-CM | POA: Diagnosis not present

## 2022-06-03 DIAGNOSIS — R948 Abnormal results of function studies of other organs and systems: Secondary | ICD-10-CM | POA: Diagnosis not present

## 2022-06-03 DIAGNOSIS — E291 Testicular hypofunction: Secondary | ICD-10-CM | POA: Diagnosis not present

## 2022-06-08 DIAGNOSIS — N401 Enlarged prostate with lower urinary tract symptoms: Secondary | ICD-10-CM | POA: Diagnosis not present

## 2022-06-08 DIAGNOSIS — E291 Testicular hypofunction: Secondary | ICD-10-CM | POA: Diagnosis not present

## 2022-06-08 DIAGNOSIS — R350 Frequency of micturition: Secondary | ICD-10-CM | POA: Diagnosis not present

## 2022-07-13 DIAGNOSIS — H2513 Age-related nuclear cataract, bilateral: Secondary | ICD-10-CM | POA: Diagnosis not present

## 2022-07-27 DIAGNOSIS — E782 Mixed hyperlipidemia: Secondary | ICD-10-CM | POA: Diagnosis not present

## 2022-07-27 DIAGNOSIS — E669 Obesity, unspecified: Secondary | ICD-10-CM | POA: Diagnosis not present

## 2022-07-27 DIAGNOSIS — E291 Testicular hypofunction: Secondary | ICD-10-CM | POA: Diagnosis not present

## 2022-07-27 DIAGNOSIS — E119 Type 2 diabetes mellitus without complications: Secondary | ICD-10-CM | POA: Diagnosis not present

## 2022-07-27 DIAGNOSIS — E1142 Type 2 diabetes mellitus with diabetic polyneuropathy: Secondary | ICD-10-CM | POA: Diagnosis not present

## 2022-07-27 DIAGNOSIS — Z79899 Other long term (current) drug therapy: Secondary | ICD-10-CM | POA: Diagnosis not present

## 2022-07-28 DIAGNOSIS — H2512 Age-related nuclear cataract, left eye: Secondary | ICD-10-CM | POA: Diagnosis not present

## 2022-07-28 DIAGNOSIS — H2511 Age-related nuclear cataract, right eye: Secondary | ICD-10-CM | POA: Diagnosis not present

## 2022-08-03 DIAGNOSIS — E1169 Type 2 diabetes mellitus with other specified complication: Secondary | ICD-10-CM | POA: Diagnosis not present

## 2022-08-03 DIAGNOSIS — R632 Polyphagia: Secondary | ICD-10-CM | POA: Diagnosis not present

## 2022-08-03 DIAGNOSIS — E669 Obesity, unspecified: Secondary | ICD-10-CM | POA: Diagnosis not present

## 2022-08-03 DIAGNOSIS — E65 Localized adiposity: Secondary | ICD-10-CM | POA: Diagnosis not present

## 2022-08-03 DIAGNOSIS — E782 Mixed hyperlipidemia: Secondary | ICD-10-CM | POA: Diagnosis not present

## 2022-08-10 DIAGNOSIS — R69 Illness, unspecified: Secondary | ICD-10-CM | POA: Diagnosis not present

## 2022-08-10 DIAGNOSIS — F411 Generalized anxiety disorder: Secondary | ICD-10-CM | POA: Diagnosis not present

## 2022-09-01 DIAGNOSIS — H2512 Age-related nuclear cataract, left eye: Secondary | ICD-10-CM | POA: Diagnosis not present

## 2022-09-01 DIAGNOSIS — Z961 Presence of intraocular lens: Secondary | ICD-10-CM | POA: Diagnosis not present

## 2022-09-15 DIAGNOSIS — Z961 Presence of intraocular lens: Secondary | ICD-10-CM | POA: Diagnosis not present

## 2022-09-23 DIAGNOSIS — E65 Localized adiposity: Secondary | ICD-10-CM | POA: Diagnosis not present

## 2022-09-23 DIAGNOSIS — E669 Obesity, unspecified: Secondary | ICD-10-CM | POA: Diagnosis not present

## 2022-09-23 DIAGNOSIS — I152 Hypertension secondary to endocrine disorders: Secondary | ICD-10-CM | POA: Diagnosis not present

## 2022-09-23 DIAGNOSIS — E1169 Type 2 diabetes mellitus with other specified complication: Secondary | ICD-10-CM | POA: Diagnosis not present

## 2022-09-23 DIAGNOSIS — Z6833 Body mass index (BMI) 33.0-33.9, adult: Secondary | ICD-10-CM | POA: Diagnosis not present

## 2022-09-29 DIAGNOSIS — Z961 Presence of intraocular lens: Secondary | ICD-10-CM | POA: Diagnosis not present

## 2022-09-29 DIAGNOSIS — Z48810 Encounter for surgical aftercare following surgery on the sense organs: Secondary | ICD-10-CM | POA: Diagnosis not present

## 2022-10-11 DIAGNOSIS — H1132 Conjunctival hemorrhage, left eye: Secondary | ICD-10-CM | POA: Diagnosis not present

## 2022-10-28 DIAGNOSIS — F411 Generalized anxiety disorder: Secondary | ICD-10-CM | POA: Diagnosis not present

## 2022-11-09 DIAGNOSIS — E1169 Type 2 diabetes mellitus with other specified complication: Secondary | ICD-10-CM | POA: Diagnosis not present

## 2022-11-09 DIAGNOSIS — R632 Polyphagia: Secondary | ICD-10-CM | POA: Diagnosis not present

## 2022-11-09 DIAGNOSIS — Z6833 Body mass index (BMI) 33.0-33.9, adult: Secondary | ICD-10-CM | POA: Diagnosis not present

## 2022-11-09 DIAGNOSIS — E669 Obesity, unspecified: Secondary | ICD-10-CM | POA: Diagnosis not present

## 2022-11-09 DIAGNOSIS — L719 Rosacea, unspecified: Secondary | ICD-10-CM | POA: Diagnosis not present

## 2022-12-07 DIAGNOSIS — E291 Testicular hypofunction: Secondary | ICD-10-CM | POA: Diagnosis not present

## 2022-12-28 DIAGNOSIS — E1169 Type 2 diabetes mellitus with other specified complication: Secondary | ICD-10-CM | POA: Diagnosis not present

## 2022-12-28 DIAGNOSIS — Z6833 Body mass index (BMI) 33.0-33.9, adult: Secondary | ICD-10-CM | POA: Diagnosis not present

## 2022-12-28 DIAGNOSIS — I152 Hypertension secondary to endocrine disorders: Secondary | ICD-10-CM | POA: Diagnosis not present

## 2022-12-28 DIAGNOSIS — E669 Obesity, unspecified: Secondary | ICD-10-CM | POA: Diagnosis not present

## 2022-12-28 DIAGNOSIS — R632 Polyphagia: Secondary | ICD-10-CM | POA: Diagnosis not present

## 2023-02-10 DIAGNOSIS — F411 Generalized anxiety disorder: Secondary | ICD-10-CM | POA: Diagnosis not present

## 2023-02-15 DIAGNOSIS — E1169 Type 2 diabetes mellitus with other specified complication: Secondary | ICD-10-CM | POA: Diagnosis not present

## 2023-02-15 DIAGNOSIS — E669 Obesity, unspecified: Secondary | ICD-10-CM | POA: Diagnosis not present

## 2023-02-15 DIAGNOSIS — E65 Localized adiposity: Secondary | ICD-10-CM | POA: Diagnosis not present

## 2023-02-15 DIAGNOSIS — R632 Polyphagia: Secondary | ICD-10-CM | POA: Diagnosis not present

## 2023-02-15 DIAGNOSIS — M792 Neuralgia and neuritis, unspecified: Secondary | ICD-10-CM | POA: Diagnosis not present

## 2023-02-15 DIAGNOSIS — Z6833 Body mass index (BMI) 33.0-33.9, adult: Secondary | ICD-10-CM | POA: Diagnosis not present

## 2023-03-01 DIAGNOSIS — N401 Enlarged prostate with lower urinary tract symptoms: Secondary | ICD-10-CM | POA: Diagnosis not present

## 2023-03-01 DIAGNOSIS — N529 Male erectile dysfunction, unspecified: Secondary | ICD-10-CM | POA: Diagnosis not present

## 2023-03-01 DIAGNOSIS — G629 Polyneuropathy, unspecified: Secondary | ICD-10-CM | POA: Diagnosis not present

## 2023-03-01 DIAGNOSIS — Z0000 Encounter for general adult medical examination without abnormal findings: Secondary | ICD-10-CM | POA: Diagnosis not present

## 2023-03-01 DIAGNOSIS — E291 Testicular hypofunction: Secondary | ICD-10-CM | POA: Diagnosis not present

## 2023-03-01 DIAGNOSIS — M5136 Other intervertebral disc degeneration, lumbar region: Secondary | ICD-10-CM | POA: Diagnosis not present

## 2023-03-01 DIAGNOSIS — Z79899 Other long term (current) drug therapy: Secondary | ICD-10-CM | POA: Diagnosis not present

## 2023-03-01 DIAGNOSIS — E1169 Type 2 diabetes mellitus with other specified complication: Secondary | ICD-10-CM | POA: Diagnosis not present

## 2023-03-01 DIAGNOSIS — E1142 Type 2 diabetes mellitus with diabetic polyneuropathy: Secondary | ICD-10-CM | POA: Diagnosis not present

## 2023-03-01 DIAGNOSIS — E782 Mixed hyperlipidemia: Secondary | ICD-10-CM | POA: Diagnosis not present

## 2023-03-01 DIAGNOSIS — Z6833 Body mass index (BMI) 33.0-33.9, adult: Secondary | ICD-10-CM | POA: Diagnosis not present

## 2023-03-01 DIAGNOSIS — E669 Obesity, unspecified: Secondary | ICD-10-CM | POA: Diagnosis not present

## 2023-09-06 DIAGNOSIS — E1169 Type 2 diabetes mellitus with other specified complication: Secondary | ICD-10-CM | POA: Diagnosis not present

## 2023-09-06 DIAGNOSIS — E538 Deficiency of other specified B group vitamins: Secondary | ICD-10-CM | POA: Diagnosis not present

## 2023-09-06 DIAGNOSIS — E66811 Obesity, class 1: Secondary | ICD-10-CM | POA: Diagnosis not present

## 2023-09-06 DIAGNOSIS — Z6832 Body mass index (BMI) 32.0-32.9, adult: Secondary | ICD-10-CM | POA: Diagnosis not present

## 2023-09-06 DIAGNOSIS — R632 Polyphagia: Secondary | ICD-10-CM | POA: Diagnosis not present

## 2023-12-07 DIAGNOSIS — F331 Major depressive disorder, recurrent, moderate: Secondary | ICD-10-CM | POA: Diagnosis not present

## 2023-12-07 DIAGNOSIS — F411 Generalized anxiety disorder: Secondary | ICD-10-CM | POA: Diagnosis not present

## 2023-12-26 DIAGNOSIS — I10 Essential (primary) hypertension: Secondary | ICD-10-CM | POA: Diagnosis not present

## 2023-12-26 DIAGNOSIS — E1169 Type 2 diabetes mellitus with other specified complication: Secondary | ICD-10-CM | POA: Diagnosis not present

## 2023-12-26 DIAGNOSIS — N401 Enlarged prostate with lower urinary tract symptoms: Secondary | ICD-10-CM | POA: Diagnosis not present

## 2023-12-26 DIAGNOSIS — I152 Hypertension secondary to endocrine disorders: Secondary | ICD-10-CM | POA: Diagnosis not present

## 2024-01-26 DIAGNOSIS — E1169 Type 2 diabetes mellitus with other specified complication: Secondary | ICD-10-CM | POA: Diagnosis not present

## 2024-01-26 DIAGNOSIS — I10 Essential (primary) hypertension: Secondary | ICD-10-CM | POA: Diagnosis not present

## 2024-01-26 DIAGNOSIS — N401 Enlarged prostate with lower urinary tract symptoms: Secondary | ICD-10-CM | POA: Diagnosis not present

## 2024-01-26 DIAGNOSIS — I152 Hypertension secondary to endocrine disorders: Secondary | ICD-10-CM | POA: Diagnosis not present

## 2024-02-26 DIAGNOSIS — N401 Enlarged prostate with lower urinary tract symptoms: Secondary | ICD-10-CM | POA: Diagnosis not present

## 2024-02-26 DIAGNOSIS — E1169 Type 2 diabetes mellitus with other specified complication: Secondary | ICD-10-CM | POA: Diagnosis not present

## 2024-02-26 DIAGNOSIS — I10 Essential (primary) hypertension: Secondary | ICD-10-CM | POA: Diagnosis not present

## 2024-02-26 DIAGNOSIS — I152 Hypertension secondary to endocrine disorders: Secondary | ICD-10-CM | POA: Diagnosis not present

## 2024-03-12 DIAGNOSIS — F411 Generalized anxiety disorder: Secondary | ICD-10-CM | POA: Diagnosis not present

## 2024-03-12 DIAGNOSIS — F331 Major depressive disorder, recurrent, moderate: Secondary | ICD-10-CM | POA: Diagnosis not present

## 2024-03-14 DIAGNOSIS — E1169 Type 2 diabetes mellitus with other specified complication: Secondary | ICD-10-CM | POA: Diagnosis not present

## 2024-03-14 DIAGNOSIS — I10 Essential (primary) hypertension: Secondary | ICD-10-CM | POA: Diagnosis not present

## 2024-03-14 DIAGNOSIS — Z6831 Body mass index (BMI) 31.0-31.9, adult: Secondary | ICD-10-CM | POA: Diagnosis not present

## 2024-03-14 DIAGNOSIS — E66811 Obesity, class 1: Secondary | ICD-10-CM | POA: Diagnosis not present

## 2024-03-14 DIAGNOSIS — E65 Localized adiposity: Secondary | ICD-10-CM | POA: Diagnosis not present

## 2024-03-27 DIAGNOSIS — I152 Hypertension secondary to endocrine disorders: Secondary | ICD-10-CM | POA: Diagnosis not present

## 2024-03-27 DIAGNOSIS — N401 Enlarged prostate with lower urinary tract symptoms: Secondary | ICD-10-CM | POA: Diagnosis not present

## 2024-03-27 DIAGNOSIS — I10 Essential (primary) hypertension: Secondary | ICD-10-CM | POA: Diagnosis not present

## 2024-03-27 DIAGNOSIS — E1169 Type 2 diabetes mellitus with other specified complication: Secondary | ICD-10-CM | POA: Diagnosis not present

## 2024-04-13 DIAGNOSIS — M19072 Primary osteoarthritis, left ankle and foot: Secondary | ICD-10-CM | POA: Diagnosis not present

## 2024-04-13 DIAGNOSIS — B353 Tinea pedis: Secondary | ICD-10-CM | POA: Diagnosis not present

## 2024-04-13 DIAGNOSIS — G629 Polyneuropathy, unspecified: Secondary | ICD-10-CM | POA: Diagnosis not present

## 2024-04-13 DIAGNOSIS — I70203 Unspecified atherosclerosis of native arteries of extremities, bilateral legs: Secondary | ICD-10-CM | POA: Diagnosis not present

## 2024-04-13 DIAGNOSIS — E1151 Type 2 diabetes mellitus with diabetic peripheral angiopathy without gangrene: Secondary | ICD-10-CM | POA: Diagnosis not present

## 2024-04-13 DIAGNOSIS — M19071 Primary osteoarthritis, right ankle and foot: Secondary | ICD-10-CM | POA: Diagnosis not present

## 2024-04-23 DIAGNOSIS — G629 Polyneuropathy, unspecified: Secondary | ICD-10-CM | POA: Diagnosis not present

## 2024-04-26 DIAGNOSIS — E1169 Type 2 diabetes mellitus with other specified complication: Secondary | ICD-10-CM | POA: Diagnosis not present

## 2024-04-26 DIAGNOSIS — E65 Localized adiposity: Secondary | ICD-10-CM | POA: Diagnosis not present

## 2024-04-26 DIAGNOSIS — I10 Essential (primary) hypertension: Secondary | ICD-10-CM | POA: Diagnosis not present

## 2024-04-26 DIAGNOSIS — Z6831 Body mass index (BMI) 31.0-31.9, adult: Secondary | ICD-10-CM | POA: Diagnosis not present

## 2024-04-26 DIAGNOSIS — E66811 Obesity, class 1: Secondary | ICD-10-CM | POA: Diagnosis not present

## 2024-04-27 DIAGNOSIS — E1169 Type 2 diabetes mellitus with other specified complication: Secondary | ICD-10-CM | POA: Diagnosis not present

## 2024-04-27 DIAGNOSIS — I10 Essential (primary) hypertension: Secondary | ICD-10-CM | POA: Diagnosis not present

## 2024-04-27 DIAGNOSIS — I152 Hypertension secondary to endocrine disorders: Secondary | ICD-10-CM | POA: Diagnosis not present

## 2024-04-27 DIAGNOSIS — N401 Enlarged prostate with lower urinary tract symptoms: Secondary | ICD-10-CM | POA: Diagnosis not present

## 2024-05-10 DIAGNOSIS — G629 Polyneuropathy, unspecified: Secondary | ICD-10-CM | POA: Diagnosis not present

## 2024-05-27 DIAGNOSIS — I10 Essential (primary) hypertension: Secondary | ICD-10-CM | POA: Diagnosis not present

## 2024-05-27 DIAGNOSIS — I152 Hypertension secondary to endocrine disorders: Secondary | ICD-10-CM | POA: Diagnosis not present

## 2024-05-27 DIAGNOSIS — N401 Enlarged prostate with lower urinary tract symptoms: Secondary | ICD-10-CM | POA: Diagnosis not present

## 2024-05-27 DIAGNOSIS — E1169 Type 2 diabetes mellitus with other specified complication: Secondary | ICD-10-CM | POA: Diagnosis not present

## 2024-06-04 DIAGNOSIS — B353 Tinea pedis: Secondary | ICD-10-CM | POA: Diagnosis not present

## 2024-06-04 DIAGNOSIS — M19071 Primary osteoarthritis, right ankle and foot: Secondary | ICD-10-CM | POA: Diagnosis not present

## 2024-06-04 DIAGNOSIS — M19072 Primary osteoarthritis, left ankle and foot: Secondary | ICD-10-CM | POA: Diagnosis not present

## 2024-06-04 DIAGNOSIS — E1151 Type 2 diabetes mellitus with diabetic peripheral angiopathy without gangrene: Secondary | ICD-10-CM | POA: Diagnosis not present

## 2024-06-04 DIAGNOSIS — I70203 Unspecified atherosclerosis of native arteries of extremities, bilateral legs: Secondary | ICD-10-CM | POA: Diagnosis not present

## 2024-06-04 DIAGNOSIS — M792 Neuralgia and neuritis, unspecified: Secondary | ICD-10-CM | POA: Diagnosis not present

## 2024-06-06 DIAGNOSIS — E538 Deficiency of other specified B group vitamins: Secondary | ICD-10-CM | POA: Diagnosis not present

## 2024-07-13 NOTE — Progress Notes (Signed)
 COLLIS THEDE                                          MRN: [id number]   07/13/2024   The [facility] Quality Team Specialist reviewed this patient medical record for the purposes of chart review for care gap closure. The following were reviewed: chart review for care gap closure-glycemic status assessment.    [facility] Quality Team

## 2024-07-17 NOTE — Progress Notes (Signed)
 Gary Gallagher                                          MRN: [id number]   07/17/2024   The [facility] Quality Team Specialist reviewed this patient medical record for the purposes of chart review for care gap closure. The following were reviewed: chart review for care gap closure-colorectal cancer screening.    [facility] Quality Team
